# Patient Record
Sex: Female | Born: 2000 | Race: Black or African American | Hispanic: No | Marital: Single | Smoking: Current some day smoker
Health system: Southern US, Community
[De-identification: ages and names within clinical notes are randomized; demographics above are authoritative.]

## PROBLEM LIST (undated history)

## (undated) ENCOUNTER — Inpatient Hospital Stay (HOSPITAL_COMMUNITY): Payer: Self-pay

## (undated) DIAGNOSIS — R569 Unspecified convulsions: Secondary | ICD-10-CM

## (undated) DIAGNOSIS — F32A Depression, unspecified: Secondary | ICD-10-CM

## (undated) DIAGNOSIS — D649 Anemia, unspecified: Secondary | ICD-10-CM

## (undated) HISTORY — DX: Unspecified convulsions: R56.9

## (undated) HISTORY — DX: Depression, unspecified: F32.A

## (undated) HISTORY — DX: Anemia, unspecified: D64.9

---
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
## 2019-01-13 ENCOUNTER — Other Ambulatory Visit: Payer: Self-pay

## 2019-01-13 ENCOUNTER — Emergency Department (HOSPITAL_COMMUNITY)
Admission: EM | Admit: 2019-01-13 | Discharge: 2019-01-13 | Disposition: A | Discharge status: Home / Self Care | Payer: Medicaid Other | Attending: Emergency Medicine | Admitting: Emergency Medicine

## 2019-01-13 ENCOUNTER — Encounter (HOSPITAL_COMMUNITY): Payer: Self-pay | Admitting: Emergency Medicine

## 2019-01-13 ENCOUNTER — Emergency Department (HOSPITAL_COMMUNITY): Payer: Medicaid Other

## 2019-01-13 DIAGNOSIS — M79671 Pain in right foot: Secondary | ICD-10-CM | POA: Diagnosis not present

## 2019-01-13 DIAGNOSIS — Z79899 Other long term (current) drug therapy: Secondary | ICD-10-CM | POA: Diagnosis not present

## 2019-01-13 MED ORDER — IBUPROFEN 400 MG PO TABS
800.0000 mg | ORAL_TABLET | Freq: Once | ORAL | Status: AC
  Administered 2019-01-13: 800 mg via ORAL
  Filled 2019-01-13: qty 2

## 2019-01-13 NOTE — ED Notes (Signed)
Patient transported to X-ray 

## 2019-01-13 NOTE — ED Triage Notes (Signed)
Pt reports burning pain and a "knot" to the side of her foot. Denies any rashes or redness, no trauma or known injury.

## 2019-01-13 NOTE — ED Provider Notes (Addendum)
[facility] EMERGENCY DEPARTMENT Provider Note   CSN: [id number] Arrival date & time: 01/13/19  1449     History Chief Complaint  Patient presents with  . Foot Pain    Amy Elliott is a 19 y.o. female.  Patient arrives to ED via POV with complaints of right foot pain. She noticed the pain starting yesterday while she was at work. She states that she stands constantly at work and felt a burning sensation and felt a knot in her foot. She treated at home with aspirin and tylenol with no relief of symptoms. She has elevated her foot with little relief in symptoms, pain worse with standing and ambulation. No fevers. She has full sensation to right foot. Foot is warm to touch with 2+ pedal pulses. She has no pertinent past medical history, no recent illness or sick contacts, vaccines UTD.        History reviewed. No pertinent past medical history.  Patient Active Problem List   Diagnosis Date Noted  . Prediabetes 03/17/2018  . Moderate episode of recurrent major depressive disorder ([place]) 04/07/2017  . GAD (generalized anxiety disorder) 04/07/2017  . Sleep difficulties 04/07/2017  . Morbid obesity ([place]) 08/09/2016    History reviewed. No pertinent surgical history.   OB History   No obstetric history on file.     Family History  Problem Relation Age of Onset  . Hypertension Mother   . Diabetes Maternal Grandfather     Social History   Tobacco Use  . Smoking status: Never Smoker  . Smokeless tobacco: Never Used  Substance Use Topics  . Alcohol use: No  . Drug use: No    Home Medications Prior to Admission medications   Medication Sig Start Date End Date Taking? Authorizing Provider  acetaminophen (TYLENOL) 500 MG tablet Take 1,000 mg by mouth every 6 (six) hours as needed for headache.    [provider]  norethindrone-ethinyl estradiol-iron (JUNEL FE 1.5/30) 1.5-30 MG-MCG tablet Take 1 tablet by mouth daily. Patient not taking: Reported  on 03/29/2018 04/07/17   Jonathon Resides T, FNP  phentermine 30 MG capsule Take 1 capsule (30 mg total) by mouth every morning. 03/29/18   Dorna Leitz, MD    Allergies    Banana  Review of Systems   Review of Systems  Constitutional: Negative for chills and fever.  HENT: Negative for ear pain and sore throat.   Eyes: Negative for pain and visual disturbance.  Respiratory: Negative for cough and shortness of breath.   Cardiovascular: Negative for chest pain and palpitations.  Gastrointestinal: Negative for abdominal pain and vomiting.  Genitourinary: Negative for dysuria and hematuria.  Musculoskeletal: Negative for arthralgias and back pain.  Skin: Negative for color change and rash.  Neurological: Negative for seizures and syncope.  All other systems reviewed and are negative.   Physical Exam Updated Vital Signs BP 128/70   Pulse 81   Temp 98.2 F (36.8 C) (Oral)   Resp 12   SpO2 100%   Physical Exam Vitals and nursing note reviewed.  Constitutional:      General: She is not in acute distress.    Appearance: Normal appearance. She is obese. She is not ill-appearing.  HENT:     Head: Normocephalic.     Right Ear: Tympanic membrane, ear canal and external ear normal.     Left Ear: Tympanic membrane, ear canal and external ear normal.     Nose: Nose normal.     Mouth/Throat:  Mouth: Mucous membranes are moist.     Pharynx: Oropharynx is clear.  Eyes:     Extraocular Movements: Extraocular movements intact.     Pupils: Pupils are equal, round, and reactive to light.  Cardiovascular:     Rate and Rhythm: Normal rate and regular rhythm.     Pulses: Normal pulses.     Heart sounds: Normal heart sounds.  Pulmonary:     Effort: Pulmonary effort is normal.     Breath sounds: Normal breath sounds.  Abdominal:     General: Bowel sounds are normal.     Palpations: Abdomen is soft.  Musculoskeletal:     Cervical back: Normal range of motion and neck supple.   Skin:    General: Skin is warm and dry.     Capillary Refill: Capillary refill takes less than 2 seconds.  Neurological:     General: No focal deficit present.     Mental Status: She is alert.     ED Results / Procedures / Treatments   Labs (all labs ordered are listed, but only abnormal results are displayed) Labs Reviewed - No data to display  EKG None  Radiology DG Foot Complete Right  Result Date: 01/13/2019 CLINICAL DATA:  Pt reports burning pain and a "knot" to the side of her foot. Denies any rashes or redness, no trauma or known injury. EXAM: RIGHT FOOT COMPLETE - 3+ VIEW COMPARISON:  None. FINDINGS: There is no evidence of fracture or dislocation. There is no evidence of arthropathy or other focal bone abnormality. Soft tissues are unremarkable. IMPRESSION: Negative right foot radiographs. Electronically Signed   By: Emmaline Kluver M.D.   On: 01/13/2019 15:46    Procedures Procedures (including critical care time)  Medications Ordered in ED Medications  ibuprofen (ADVIL) tablet 800 mg (800 mg Oral Given 01/13/19 1542)    ED Course  I have reviewed the triage vital signs and the nursing notes.  Pertinent labs & imaging results that were available during my care of the patient were reviewed by me and considered in my medical decision making (see chart for details).    MDM Rules/Calculators/A&P                      Pain to tarsometatarsal joint head at 5th metatarsal on right foot with associated lateral right foot pain on palpation. No swelling or redness to foot noticed. Pain with standing and ambulation, states that pain is "burning" sensation. Works standing up all day and states pain increased with prolonged standing and ambulation. Good sensation to all toes and able to move all toes. DP pulses 2+. Will obtain XR of right foot and treat pain with ibuprofen. Will reassess s/p XR.   1550: XR reviewed by myself, no fracture identified. There is no avulsion  fracture or diastasis. Unlikely gout given patients age and location of pain. XR shows no arthropathy, so unlikely RA. This is likely a stress-induced injury given patients weight and job requirements. Will recommend RICE therapy and apply ace wrap. Can treat with ibuprofen for pain, follow up with PCP if no improvement in pain within the next week. Patient in agreement with this plan. Will send home with work note for Monday, she will use these therapies over the weekend and if she feels better she will go to work on Monday.   Final Clinical Impression(s) / ED Diagnoses Final diagnoses:  Right foot pain    Rx / DC Orders ED Discharge Orders  None       Orma Flaming, NP 01/13/19 1559    Orma Flaming, NP 01/13/19 1612    Vicki Mallet, MD 01/13/19 [phone]

## 2019-11-08 DIAGNOSIS — Z20822 Contact with and (suspected) exposure to covid-19: Secondary | ICD-10-CM | POA: Diagnosis not present

## 2020-02-13 ENCOUNTER — Encounter: Payer: Self-pay | Admitting: Pediatrics

## 2020-02-13 ENCOUNTER — Other Ambulatory Visit (HOSPITAL_COMMUNITY)
Admission: RE | Admit: 2020-02-13 | Discharge: 2020-02-13 | Disposition: A | Discharge status: Home / Self Care | Payer: Medicaid Other | Source: Ambulatory Visit | Attending: Pediatrics | Admitting: Pediatrics

## 2020-02-13 ENCOUNTER — Other Ambulatory Visit: Payer: Self-pay

## 2020-02-13 ENCOUNTER — Ambulatory Visit (INDEPENDENT_AMBULATORY_CARE_PROVIDER_SITE_OTHER): Payer: Medicaid Other | Admitting: Pediatrics

## 2020-02-13 VITALS — BP 126/74 | Ht 66.4 in | Wt 219.0 lb

## 2020-02-13 DIAGNOSIS — A530 Latent syphilis, unspecified as early or late: Secondary | ICD-10-CM

## 2020-02-13 DIAGNOSIS — Z0001 Encounter for general adult medical examination with abnormal findings: Secondary | ICD-10-CM | POA: Diagnosis not present

## 2020-02-13 DIAGNOSIS — R634 Abnormal weight loss: Secondary | ICD-10-CM | POA: Diagnosis not present

## 2020-02-13 DIAGNOSIS — Z113 Encounter for screening for infections with a predominantly sexual mode of transmission: Secondary | ICD-10-CM | POA: Insufficient documentation

## 2020-02-13 DIAGNOSIS — N644 Mastodynia: Secondary | ICD-10-CM | POA: Diagnosis not present

## 2020-02-13 DIAGNOSIS — Z114 Encounter for screening for human immunodeficiency virus [HIV]: Secondary | ICD-10-CM | POA: Diagnosis not present

## 2020-02-13 DIAGNOSIS — Z3201 Encounter for pregnancy test, result positive: Secondary | ICD-10-CM | POA: Diagnosis not present

## 2020-02-13 DIAGNOSIS — Z0000 Encounter for general adult medical examination without abnormal findings: Secondary | ICD-10-CM

## 2020-02-13 DIAGNOSIS — Z23 Encounter for immunization: Secondary | ICD-10-CM | POA: Diagnosis not present

## 2020-02-13 LAB — POCT RAPID HIV: Rapid HIV, POC: NEGATIVE

## 2020-02-13 LAB — POCT URINE PREGNANCY: Preg Test, Ur: POSITIVE — AB

## 2020-02-13 NOTE — Progress Notes (Signed)
Adolescent Well Care Visit Amy Elliott is a 20 y.o. female who is here for well care.    PCP:  Ancil LinseyGrant, Breelle Hollywood L, MD   History was provided by the patient and mother.   Current Issues: Current concerns include concerns for STI and pregnancy testing  Heart condition referral due to mom having surgery recently .   Breast pain- having strong pain that feels like nerves. Does not last for a long time. Rubs it to make it better. Does do regular breast exams.   Nutrition: Nutrition/Eating Behaviors: has been limiting portions but unsure how she lost as much weight  Adequate calcium in diet?: yes  Supplements/ Vitamins: none   Social Screening: Living with boyfriend and working and going to school.    Menstruation:   No LMP recorded. 01/25/2020 Menstrual History: stopped depo and took OCPs but stopped because she did not like how it made her feel.    Confidential Social History: Smokes marijuana occassionally and drinks alcohol.   Sexually Active?  yes  ; unprotected on birthday Pregnancy Prevention: none   Safe at home, in school & in relationships?  Yes Safe to self?  Yes   Screenings: Patient has a dental home: yes   Physical Exam:  Vitals:   02/13/20 1343  BP: 126/74  Weight: 219 lb (99.3 kg)  Height: 5' 6.4" (1.687 m)   Wt Readings from Last 3 Encounters:  02/13/20 219 lb (99.3 kg)  03/29/18 265 lb (120.2 kg) (>99 %, Z= 2.59)*  03/01/18 262 lb 12.8 oz (119.2 kg) (>99 %, Z= 2.57)*   * Growth percentiles are based on CDC (Girls, 2-20 Years) data.    BP 126/74 (BP Location: Right Arm, Patient Position: Sitting)   Ht 5' 6.4" (1.687 m)   Wt 219 lb (99.3 kg)   BMI 34.92 kg/m  Body mass index: body mass index is 34.92 kg/m. Growth percentile SmartLinks can only be used for patients less than 21 years old.   Hearing Screening   125Hz  250Hz  500Hz  1000Hz  2000Hz  3000Hz  4000Hz  6000Hz  8000Hz   Right ear:   20 20 20  20     Left ear:   20 20 20  20       Visual Acuity  Screening   Right eye Left eye Both eyes  Without correction:     With correction: 20/20 20/30 20/20   Comments: With contact on   General Appearance:   alert, oriented, no acute distress  HENT: Normocephalic, no obvious abnormality, conjunctiva clear  Mouth:   Normal appearing teeth, no obvious discoloration, dental caries, or dental caps  Neck:   Supple; thyroid: no enlargement, symmetric, no tenderness/mass/nodules  Chest No breast abnormalities   Lungs:   Clear to auscultation bilaterally, normal work of breathing  Heart:   Regular rate and rhythm, S1 and S2 normal, no murmurs;   Abdomen:   Soft, non-tender, no mass, or organomegaly  GU genitalia not examined  Musculoskeletal:   Tone and strength strong and symmetrical, all extremities               Lymphatic:   No cervical adenopathy  Skin/Hair/Nails:   Skin warm, dry and intact, no rashes, no bruises or petechiae  Neurologic:   Strength, gait, and coordination normal and age-appropriate   Recent Results (from the past 2160 hour(s))  Urine cytology ancillary only     Status: None   Collection Time: 02/13/20  1:41 PM  Result Value Ref Range   Chlamydia Negative  Neisseria Gonorrhea Negative    Comment Normal Reference Ranger Chlamydia - Negative    Comment      Normal Reference Range Neisseria Gonorrhea - Negative  POCT Rapid HIV     Status: Normal   Collection Time: 02/13/20  2:13 PM  Result Value Ref Range   Rapid HIV, POC Negative   TSH + free T4     Status: None   Collection Time: 02/13/20  2:36 PM  Result Value Ref Range   TSH W/REFLEX TO FT4 0.67 mIU/L    Comment:           Reference Range .           > or = 20 Years  0.40-4.50 .                Pregnancy Ranges           First trimester    0.26-2.66           Second trimester   0.55-2.73           Third trimester    0.43-2.91   Hemoglobin A1c     Status: None   Collection Time: 02/13/20  2:36 PM  Result Value Ref Range   Hgb A1c MFr Bld 5.3 <5.7 % of  total Hgb    Comment: For the purpose of screening for the presence of diabetes: . <5.7%       Consistent with the absence of diabetes 5.7-6.4%    Consistent with increased risk for diabetes             (prediabetes) > or =6.5%  Consistent with diabetes . This assay result is consistent with a decreased risk of diabetes. . Currently, no consensus exists regarding use of hemoglobin A1c for diagnosis of diabetes in children. . According to American Diabetes Association (ADA) guidelines, hemoglobin A1c <7.0% represents optimal control in non-pregnant diabetic patients. Different metrics may apply to specific patient populations.  Standards of Medical Care in Diabetes(ADA). .    Mean Plasma Glucose 105 mg/dL   eAG (mmol/L) 5.8 mmol/L  CBC with Differential/Platelet     Status: Abnormal   Collection Time: 02/13/20  2:36 PM  Result Value Ref Range   WBC 5.9 3.8 - 10.8 Thousand/uL   RBC 3.98 3.80 - 5.10 Million/uL   Hemoglobin 11.4 (L) 11.7 - 15.5 g/dL   HCT 75.1 (L) 02.5 - 85.2 %   MCV 85.7 80.0 - 100.0 fL   MCH 28.6 27.0 - 33.0 pg   MCHC 33.4 32.0 - 36.0 g/dL   RDW 77.8 24.2 - 35.3 %   Platelets 308 140 - 400 Thousand/uL   MPV 10.7 7.5 - 12.5 fL   Neutro Abs 2,974 1,500 - 7,800 cells/uL   Lymphs Abs 2,336 850 - 3,900 cells/uL   Absolute Monocytes 419 200 - 950 cells/uL   Eosinophils Absolute 142 15 - 500 cells/uL   Basophils Absolute 30 0 - 200 cells/uL   Neutrophils Relative % 50.4 %   Total Lymphocyte 39.6 %   Monocytes Relative 7.1 %   Eosinophils Relative 2.4 %   Basophils Relative 0.5 %  Lipid panel     Status: None   Collection Time: 02/13/20  2:36 PM  Result Value Ref Range   Cholesterol 151 <200 mg/dL   HDL 71 > OR = 50 mg/dL   Triglycerides 40 <614 mg/dL   LDL Cholesterol (Calc) 69 mg/dL (calc)    Comment: Reference range: <100 .  Desirable range <100 mg/dL for primary prevention;   <70 mg/dL for patients with CHD or diabetic patients  with > or = 2 CHD  risk factors. Marland Kitchen LDL-C is now calculated using the Martin-Hopkins  calculation, which is a validated novel method providing  better accuracy than the Friedewald equation in the  estimation of LDL-C.  Horald Pollen et al. Lenox Ahr. 5277;824(23): 2061-2068  (http://education.[facility].com/faq/FAQ164)    Total CHOL/HDL Ratio 2.1 <5.0 (calc)   Non-HDL Cholesterol (Calc) 80 <536 mg/dL (calc)    Comment: For patients with diabetes plus 1 major ASCVD risk  factor, treating to a non-HDL-C goal of <100 mg/dL  (LDL-C of <14 mg/dL) is considered a therapeutic  option.   VITAMIN D 25 Hydroxy (Vit-D Deficiency, Fractures)     Status: Abnormal   Collection Time: 02/13/20  2:36 PM  Result Value Ref Range   Vit D, 25-Hydroxy 11 (L) 30 - 100 ng/mL    Comment: Vitamin D Status         25-OH Vitamin D: . Deficiency:                    <20 ng/mL Insufficiency:             20 - 29 ng/mL Optimal:                 > or = 30 ng/mL . For 25-OH Vitamin D testing on patients on  D2-supplementation and patients for whom quantitation  of D2 and D3 fractions is required, the QuestAssureD(TM) 25-OH VIT D, (D2,D3), LC/MS/MS is recommended: order  code 43154 (patients >66yrs). See Note 1 . Note 1 . For additional information, please refer to  http://education.[facility].com/faq/FAQ199  (This link is being provided for informational/ educational purposes only.)   Comprehensive metabolic panel     Status: None   Collection Time: 02/13/20  2:36 PM  Result Value Ref Range   Glucose, Bld 74 65 - 99 mg/dL    Comment: .            Fasting reference interval .    BUN 11 7 - 25 mg/dL   Creat 0.08 6.76 - 1.95 mg/dL   BUN/Creatinine Ratio NOT APPLICABLE 6 - 22 (calc)   Sodium 138 135 - 146 mmol/L   Potassium 4.1 3.5 - 5.3 mmol/L   Chloride 105 98 - 110 mmol/L   CO2 23 20 - 32 mmol/L   Calcium 9.4 8.6 - 10.2 mg/dL   Total Protein 6.8 6.1 - 8.1 g/dL   Albumin 4.0 3.6 - 5.1 g/dL   Globulin 2.8 1.9 - 3.7  g/dL (calc)   AG Ratio 1.4 1.0 - 2.5 (calc)   Total Bilirubin 0.5 0.2 - 1.2 mg/dL   Alkaline phosphatase (APISO) 60 31 - 125 U/L   AST 16 10 - 30 U/L   ALT 7 6 - 29 U/L  RPR     Status: Abnormal   Collection Time: 02/13/20  2:36 PM  Result Value Ref Range   RPR Ser Ql REACTIVE (A) NON-REACTI  Rpr titer     Status: Abnormal   Collection Time: 02/13/20  2:36 PM  Result Value Ref Range   RPR Titer 1:1 (H)   Fluorescent treponemal ab(fta)-IgG-bld     Status: None   Collection Time: 02/13/20  2:36 PM  Result Value Ref Range   Fluorescent Treponemal ABS NON-REACTIVE NON-REACTI    Comment: Verified by repeat analysis. . The FTA-ABS is a treponemal assay that is  intended to  be used with other tests (e.g. RPR) as part of a  diagnostic algorithm in the diagnosis of syphilis. .   Urine cytology ancillary only     Status: Abnormal   Collection Time: 02/13/20  3:51 PM  Result Value Ref Range   Trichomonas Negative    Chlamydia Negative    Neisseria Gonorrhea Negative    Candida Urine Negative    Bacterial Vaginitis-Urine Positive (A)    Molecular Comment      This specimen does not meet the strict criteria set by the FDA. The   Molecular Comment      result interpretation should be considered in conjunction with the   Molecular Comment patient's clinical history.    Comment Normal Reference Range Trichomonas - Negative    Comment Normal Reference Ranger Chlamydia - Negative    Comment      Normal Reference Range Neisseria Gonorrhea - Negative  POCT urine pregnancy     Status: Abnormal   Collection Time: 02/13/20  5:52 PM  Result Value Ref Range   Preg Test, Ur Positive (A) Negative    Comment: VERY FAINT  Pregnancy, urine     Status: None   Collection Time: 02/13/20  5:55 PM  Result Value Ref Range   Preg Test, Ur NEGATIVE NEGATIVE  B-HCG Quant     Status: None   Collection Time: 02/13/20  5:55 PM  Result Value Ref Range   HCG, Total, QN <3 mIU/mL    Comment: Gestational  Age   Expected hCG values (mIU/mL) <1 Week:                 5-50 1-2 Weeks:               50-500 2-3 Weeks:               [phone] 3-4 Weeks:               500-10000 4-5 Weeks:               1000-50000 5-6 Weeks:               10000-100000 6-8 Weeks:               15000-200000 2-3 Months:              10000-100000 The table above provides only a very rough estimate of gestational age and should be used only in conjunction with other methods for establishing gestational age. Much more reliable and accurate estimations of gestational age may be obtained by using LMP or ultrasound. . . Values from different assay methods may vary. The use of this assay to monitor or to diagnose  patients with cancer or any condition unrelated to pregnancy has not been cleared or approved by the FDA or the manufacturer of the assay.   I-Stat beta hCG blood, ED     Status: None   Collection Time: 02/25/20  9:29 AM  Result Value Ref Range   I-stat hCG, quantitative <5.0 <5 mIU/mL   Comment 3            Comment:   GEST. AGE      CONC.  (mIU/mL)   <=1 WEEK        5 - 50     2 WEEKS       50 - 500     3 WEEKS       100 - 10,000  4 WEEKS     1,000 - 30,000        FEMALE AND NON-PREGNANT FEMALE:     LESS THAN 5 mIU/mL   Urinalysis, Routine w reflex microscopic Urine, Clean Catch     Status: Abnormal   Collection Time: 02/25/20  9:53 AM  Result Value Ref Range   Color, Urine YELLOW YELLOW   APPearance HAZY (A) CLEAR   Specific Gravity, Urine 1.032 (H) 1.005 - 1.030   pH 5.0 5.0 - 8.0   Glucose, UA NEGATIVE NEGATIVE mg/dL   Hgb urine dipstick LARGE (A) NEGATIVE   Bilirubin Urine NEGATIVE NEGATIVE   Ketones, ur NEGATIVE NEGATIVE mg/dL   Protein, ur 30 (A) NEGATIVE mg/dL   Nitrite NEGATIVE NEGATIVE   Leukocytes,Ua NEGATIVE NEGATIVE   RBC / HPF >50 (H) 0 - 5 RBC/hpf   WBC, UA 0-5 0 - 5 WBC/hpf   Bacteria, UA NONE SEEN NONE SEEN   Squamous Epithelial / LPF 6-10 0 - 5   Mucus PRESENT      Comment: Performed at [facility], [address], [place], [place] [address]  Basic metabolic panel     Status: None   Collection Time: 02/25/20  9:58 AM  Result Value Ref Range   Sodium 140 135 - 145 mmol/L   Potassium 3.9 3.5 - 5.1 mmol/L   Chloride 106 98 - 111 mmol/L   CO2 25 22 - 32 mmol/L   Glucose, Bld 97 70 - 99 mg/dL    Comment: Glucose reference range applies only to samples taken after fasting for at least 8 hours.   BUN 9 6 - 20 mg/dL   Creatinine, Ser 6.57 0.44 - 1.00 mg/dL   Calcium 9.2 8.9 - 84.6 mg/dL   GFR, Estimated >96 >29 mL/min    Comment: (NOTE) Calculated using the CKD-EPI Creatinine Equation (2021)    Anion gap 9 5 - 15    Comment: Performed at [facility], [address] [place], [place] [address]  CBC with Differential     Status: None   Collection Time: 02/25/20  9:58 AM  Result Value Ref Range   WBC 6.0 4.0 - 10.5 K/uL   RBC 4.44 3.87 - 5.11 MIL/uL   Hemoglobin 12.7 12.0 - 15.0 g/dL   HCT 32.4 40.1 - 02.7 %   MCV 88.5 80.0 - 100.0 fL   MCH 28.6 26.0 - 34.0 pg   MCHC 32.3 30.0 - 36.0 g/dL   RDW 25.3 66.4 - 40.3 %   Platelets 308 150 - 400 K/uL   nRBC 0.0 0.0 - 0.2 %   Neutrophils Relative % 46 %   Neutro Abs 2.8 1.7 - 7.7 K/uL   Lymphocytes Relative 42 %   Lymphs Abs 2.5 0.7 - 4.0 K/uL   Monocytes Relative 8 %   Monocytes Absolute 0.5 0.1 - 1.0 K/uL   Eosinophils Relative 3 %   Eosinophils Absolute 0.2 0.0 - 0.5 K/uL   Basophils Relative 1 %   Basophils Absolute 0.0 0.0 - 0.1 K/uL   Immature Granulocytes 0 %   Abs Immature Granulocytes 0.01 0.00 - 0.07 K/uL    Comment: Performed at [facility], [address], [place], [place] [address]  RPR     Status: Abnormal   Collection Time: 02/25/20 10:09 AM  Result Value Ref Range   RPR Ser Ql Reactive (A) NON REACTIVE    Comment: SENT FOR CONFIRMATION   RPR Titer 1:1  Comment: Performed at [facility], [address] [place], [place] [address]  GC/Chlamydia probe  amp ()not at [facility]     Status: None   Collection Time: 02/25/20 10:09 AM  Result Value Ref Range   Chlamydia Negative    Neisseria Gonorrhea Negative    Comment Normal Reference Ranger Chlamydia - Negative    Comment      Normal Reference Range Neisseria Gonorrhea - Negative  HIV Antibody (routine testing w rflx)     Status: None   Collection Time: 02/25/20 10:09 AM  Result Value Ref Range   HIV Screen 4th Generation wRfx Non Reactive Non Reactive    Comment: Performed at [facility], [address], [place], [place] [address]  T.pallidum Ab, Total     Status: None   Collection Time: 02/25/20 10:09 AM  Result Value Ref Range   T Pallidum Abs Non Reactive Non Reactive    Comment: (NOTE) Performed At: [facility] [address] [place], [place] [id number] Jolene Schimke MD KG:[id number]   Wet prep, genital     Status: None   Collection Time: 02/25/20 10:10 AM   Specimen: Genital  Result Value Ref Range   Yeast Wet Prep HPF POC NONE SEEN NONE SEEN   Trich, Wet Prep NONE SEEN NONE SEEN   Clue Cells Wet Prep HPF POC NONE SEEN NONE SEEN   WBC, Wet Prep HPF POC NONE SEEN NONE SEEN   Sperm NONE SEEN     Comment: Performed at [facility], [address], [place], [place] [address]     Assessment and Plan:   Amy Elliott is a 20 yo F here for well check.   1. Routine screening for STI (sexually transmitted infection)  - Urine cytology ancillary only - POCT Rapid HIV - POCT urine pregnancy - RPR - Ambulatory referral to Gynecology - Urine cytology ancillary only  2. Encounter for general adult medical examination without abnormal findings Hgb A1 c trending down with weight loss and improved BMI.  Noted to have Vitamin D deficiency and should begin daily 2000 Units until seen in adult clinic with discussion for 50k U  - Hemoglobin A1c - CBC with Differential/Platelet - Lipid panel - VITAMIN D 25 Hydroxy (Vit-D Deficiency, Fractures) -  Comprehensive metabolic panel - Ambulatory referral to [facility]  4. Weight loss  - TSH + free T4  5. Breast pain  - Ambulatory referral to Gynecology  6. Positive urine pregnancy test Urine test positive but beta negative  - Pregnancy, urine - B-HCG Quant  7. Positive RPR test RPR reactive 1:1 with negative treponemal antibodies False positive No treatment    Return in 1 year (on 02/12/2021).Marland Kitchen  Ancil Linsey, MD

## 2020-02-14 LAB — URINE CYTOLOGY ANCILLARY ONLY
Bacterial Vaginitis-Urine: POSITIVE — AB
Candida Urine: NEGATIVE
Chlamydia: NEGATIVE
Chlamydia: NEGATIVE
Comment: NEGATIVE
Comment: NORMAL
Comment: NEGATIVE
Comment: NEGATIVE
Comment: NORMAL
Neisseria Gonorrhea: NEGATIVE
Neisseria Gonorrhea: NEGATIVE
Trichomonas: NEGATIVE

## 2020-02-14 LAB — PREGNANCY, URINE: Preg Test, Ur: NEGATIVE

## 2020-02-14 LAB — HCG, QUANTITATIVE, PREGNANCY: HCG, Total, QN: <3 m[IU]/mL

## 2020-02-15 LAB — COMPREHENSIVE METABOLIC PANEL
AG Ratio: 1.4 (calc) (ref 1.0–2.5)
ALT: 7 U/L (ref 6–29)
AST: 16 U/L (ref 10–30)
Albumin: 4 g/dL (ref 3.6–5.1)
Alkaline phosphatase (APISO): 60 U/L (ref 31–125)
BUN: 11 mg/dL (ref 7–25)
CO2: 23 mmol/L (ref 20–32)
Calcium: 9.4 mg/dL (ref 8.6–10.2)
Chloride: 105 mmol/L (ref 98–110)
Creat: 0.83 mg/dL (ref 0.50–1.10)
Globulin: 2.8 g/dL (calc) (ref 1.9–3.7)
Glucose, Bld: 74 mg/dL (ref 65–99)
Potassium: 4.1 mmol/L (ref 3.5–5.3)
Sodium: 138 mmol/L (ref 135–146)
Total Bilirubin: 0.5 mg/dL (ref 0.2–1.2)
Total Protein: 6.8 g/dL (ref 6.1–8.1)

## 2020-02-15 LAB — CBC WITH DIFFERENTIAL/PLATELET
Absolute Monocytes: 419 cells/uL (ref 200–950)
Basophils Absolute: 30 cells/uL (ref 0–200)
Basophils Relative: 0.5 %
Eosinophils Absolute: 142 cells/uL (ref 15–500)
Eosinophils Relative: 2.4 %
HCT: 34.1 % — ABNORMAL LOW (ref 35.0–45.0)
Hemoglobin: 11.4 g/dL — ABNORMAL LOW (ref 11.7–15.5)
Lymphs Abs: 2336 cells/uL (ref 850–3900)
MCH: 28.6 pg (ref 27.0–33.0)
MCHC: 33.4 g/dL (ref 32.0–36.0)
MCV: 85.7 fL (ref 80.0–100.0)
MPV: 10.7 fL (ref 7.5–12.5)
Monocytes Relative: 7.1 %
Neutro Abs: 2974 cells/uL (ref 1500–7800)
Neutrophils Relative %: 50.4 %
Platelets: 308 10*3/uL (ref 140–400)
RBC: 3.98 10*6/uL (ref 3.80–5.10)
RDW: 12.5 % (ref 11.0–15.0)
Total Lymphocyte: 39.6 %
WBC: 5.9 10*3/uL (ref 3.8–10.8)

## 2020-02-15 LAB — FLUORESCENT TREPONEMAL AB(FTA)-IGG-BLD: Fluorescent Treponemal ABS: NONREACTIVE

## 2020-02-15 LAB — LIPID PANEL
Cholesterol: 151 mg/dL (ref <200)
HDL: 71 mg/dL (ref >=50)
LDL Cholesterol (Calc): 69 mg/dL (calc)
Non-HDL Cholesterol (Calc): 80 mg/dL (calc) (ref <130)
Total CHOL/HDL Ratio: 2.1 (calc) (ref <5.0)
Triglycerides: 40 mg/dL (ref <150)

## 2020-02-15 LAB — HEMOGLOBIN A1C
Hgb A1c MFr Bld: 5.3 % of total Hgb (ref <5.7)
Mean Plasma Glucose: 105 mg/dL
eAG (mmol/L): 5.8 mmol/L

## 2020-02-15 LAB — TSH+FREE T4: TSH W/REFLEX TO FT4: 0.67 mIU/L

## 2020-02-15 LAB — VITAMIN D 25 HYDROXY (VIT D DEFICIENCY, FRACTURES): Vit D, 25-Hydroxy: 11 ng/mL — ABNORMAL LOW (ref 30–100)

## 2020-02-15 LAB — RPR TITER: RPR Titer: 1:1 {titer} — ABNORMAL HIGH

## 2020-02-15 LAB — RPR: RPR Ser Ql: REACTIVE — AB

## 2020-02-25 ENCOUNTER — Emergency Department (HOSPITAL_COMMUNITY)
Admission: EM | Admit: 2020-02-25 | Discharge: 2020-02-25 | Disposition: A | Discharge status: Home / Self Care | Payer: Medicaid Other | Attending: Emergency Medicine | Admitting: Emergency Medicine

## 2020-02-25 ENCOUNTER — Other Ambulatory Visit: Payer: Self-pay

## 2020-02-25 ENCOUNTER — Encounter (HOSPITAL_COMMUNITY): Payer: Self-pay | Admitting: *Deleted

## 2020-02-25 DIAGNOSIS — I10 Essential (primary) hypertension: Secondary | ICD-10-CM | POA: Insufficient documentation

## 2020-02-25 DIAGNOSIS — N939 Abnormal uterine and vaginal bleeding, unspecified: Secondary | ICD-10-CM | POA: Diagnosis not present

## 2020-02-25 LAB — CBC WITH DIFFERENTIAL/PLATELET
Abs Immature Granulocytes: 0.01 10*3/uL (ref 0.00–0.07)
Basophils Absolute: 0 10*3/uL (ref 0.0–0.1)
Basophils Relative: 1 %
Eosinophils Absolute: 0.2 10*3/uL (ref 0.0–0.5)
Eosinophils Relative: 3 %
HCT: 39.3 % (ref 36.0–46.0)
Hemoglobin: 12.7 g/dL (ref 12.0–15.0)
Immature Granulocytes: 0 %
Lymphocytes Relative: 42 %
Lymphs Abs: 2.5 10*3/uL (ref 0.7–4.0)
MCH: 28.6 pg (ref 26.0–34.0)
MCHC: 32.3 g/dL (ref 30.0–36.0)
MCV: 88.5 fL (ref 80.0–100.0)
Monocytes Absolute: 0.5 10*3/uL (ref 0.1–1.0)
Monocytes Relative: 8 %
Neutro Abs: 2.8 10*3/uL (ref 1.7–7.7)
Neutrophils Relative %: 46 %
Platelets: 308 10*3/uL (ref 150–400)
RBC: 4.44 MIL/uL (ref 3.87–5.11)
RDW: 13.1 % (ref 11.5–15.5)
WBC: 6 10*3/uL (ref 4.0–10.5)
nRBC: 0 % (ref 0.0–0.2)

## 2020-02-25 LAB — WET PREP, GENITAL
Clue Cells Wet Prep HPF POC: NONE SEEN
Sperm: NONE SEEN
Trich, Wet Prep: NONE SEEN
WBC, Wet Prep HPF POC: NONE SEEN
Yeast Wet Prep HPF POC: NONE SEEN

## 2020-02-25 LAB — BASIC METABOLIC PANEL
Anion gap: 9 (ref 5–15)
BUN: 9 mg/dL (ref 6–20)
CO2: 25 mmol/L (ref 22–32)
Calcium: 9.2 mg/dL (ref 8.9–10.3)
Chloride: 106 mmol/L (ref 98–111)
Creatinine, Ser: 0.86 mg/dL (ref 0.44–1.00)
GFR, Estimated: >60 mL/min (ref >60)
Glucose, Bld: 97 mg/dL (ref 70–99)
Potassium: 3.9 mmol/L (ref 3.5–5.1)
Sodium: 140 mmol/L (ref 135–145)

## 2020-02-25 LAB — URINALYSIS, ROUTINE W REFLEX MICROSCOPIC
Bacteria, UA: NONE SEEN
Bilirubin Urine: NEGATIVE
Glucose, UA: NEGATIVE mg/dL
Ketones, ur: NEGATIVE mg/dL
Leukocytes,Ua: NEGATIVE
Nitrite: NEGATIVE
Protein, ur: 30 mg/dL — AB
RBC / HPF: >50 RBC/hpf — ABNORMAL HIGH (ref 0–5)
Specific Gravity, Urine: 1.032 — ABNORMAL HIGH (ref 1.005–1.030)
pH: 5 (ref 5.0–8.0)

## 2020-02-25 LAB — I-STAT BETA HCG BLOOD, ED (MC, WL, AP ONLY): I-stat hCG, quantitative: <5 m[IU]/mL (ref <5)

## 2020-02-25 LAB — HIV ANTIBODY (ROUTINE TESTING W REFLEX): HIV Screen 4th Generation wRfx: NONREACTIVE

## 2020-02-25 NOTE — ED Triage Notes (Signed)
Pt reports having + pregnancy test and estimates she is approx [redacted] weeks pregnant. Having mild vaginal bleeding x 2 days. No distress noted at triage.

## 2020-02-25 NOTE — ED Provider Notes (Signed)
[facility] EMERGENCY DEPARTMENT Provider Note   CSN: [id number] Arrival date & time: 02/25/20  7106     History Chief Complaint  Patient presents with  . Vaginal Bleeding    Amy Elliott is a 20 y.o. female.  HPI She presents for evaluation of vaginal bleeding for 2 days.  She had a positive pregnancy test, urine, on 02/13/2020.  That same day, a hCG was done and was negative.  She states her last menstrual cycle was about 1 month ago.  She states her current bleeding is like a menses.  She denies vaginal discharge prior to the bleeding.  She denies weakness, dizziness, fever, chills, nausea or vomiting.  There are no other known modifying factors.    History reviewed. No pertinent past medical history.  Patient Active Problem List   Diagnosis Date Noted  . Prediabetes 03/17/2018  . Moderate episode of recurrent major depressive disorder (HCC) 04/07/2017  . GAD (generalized anxiety disorder) 04/07/2017  . Sleep difficulties 04/07/2017  . Morbid obesity (HCC) 08/09/2016    History reviewed. No pertinent surgical history.   OB History    Gravida  1   Para      Term      Preterm      AB      Living        SAB      IAB      Ectopic      Multiple      Live Births              Family History  Problem Relation Age of Onset  . Hypertension Mother   . Diabetes Maternal Grandfather     Social History   Tobacco Use  . Smoking status: Never Smoker  . Smokeless tobacco: Never Used  Vaping Use  . Vaping Use: Never used  Substance Use Topics  . Alcohol use: No  . Drug use: No    Home Medications Prior to Admission medications   Medication Sig Start Date End Date Taking? Authorizing Provider  acetaminophen (TYLENOL) 500 MG tablet Take 1,000 mg by mouth every 6 (six) hours as needed for headache.    [provider]  norethindrone-ethinyl estradiol-iron (JUNEL FE 1.5/30) 1.5-30 MG-MCG tablet Take 1 tablet by mouth  daily. Patient not taking: Reported on 02/13/2020 04/07/17   Alfonso Ramus T, FNP  phentermine 30 MG capsule Take 1 capsule (30 mg total) by mouth every morning. Patient not taking: Reported on 02/13/2020 03/29/18   Alexander Mt, MD    Allergies    Banana and Other  Review of Systems   Review of Systems  All other systems reviewed and are negative.   Physical Exam Updated Vital Signs BP (!) 148/98 (BP Location: Right Arm)   Pulse (!) 58   Temp 98.4 F (36.9 C) (Oral)   Resp 20   LMP 01/26/2020   SpO2 100%   Physical Exam Vitals and nursing note reviewed.  Constitutional:      General: She is not in acute distress.    Appearance: She is well-developed and well-nourished. She is not ill-appearing, toxic-appearing or diaphoretic.  HENT:     Head: Normocephalic and atraumatic.     Right Ear: External ear normal.     Left Ear: External ear normal.  Eyes:     Extraocular Movements: EOM normal.     Conjunctiva/sclera: Conjunctivae normal.     Pupils: Pupils are equal, round, and reactive to light.  Neck:     Trachea: Phonation normal.  Cardiovascular:     Rate and Rhythm: Normal rate.  Pulmonary:     Effort: Pulmonary effort is normal.  Chest:     Chest wall: No bony tenderness.  Abdominal:     General: There is no distension.  Genitourinary:    Comments: Blood on perineum otherwise normal external female genitalia.  Blood in vagina, appears to be coming from the cervical os.  Cervical os and cervix appeared normal.  Samples taken for testing.  On bimanual examination there is no pelvic tenderness or palpable organomegaly. Musculoskeletal:        General: Normal range of motion.     Cervical back: Normal range of motion and neck supple.  Skin:    General: Skin is warm, dry and intact.  Neurological:     Mental Status: She is alert and oriented to person, place, and time.     Cranial Nerves: No cranial nerve deficit.     Sensory: No sensory deficit.     Motor:  No abnormal muscle tone.     Coordination: Coordination normal.  Psychiatric:        Mood and Affect: Mood and affect and mood normal.        Behavior: Behavior normal.        Thought Content: Thought content normal.        Judgment: Judgment normal.     ED Results / Procedures / Treatments   Labs (all labs ordered are listed, but only abnormal results are displayed) Labs Reviewed  URINALYSIS, ROUTINE W REFLEX MICROSCOPIC - Abnormal; Notable for the following components:      Result Value   APPearance HAZY (*)    Specific Gravity, Urine 1.032 (*)    Hgb urine dipstick LARGE (*)    Protein, ur 30 (*)    RBC / HPF >50 (*)    All other components within normal limits  WET PREP, GENITAL  BASIC METABOLIC PANEL  CBC WITH DIFFERENTIAL/PLATELET  RPR  HIV ANTIBODY (ROUTINE TESTING W REFLEX)  I-STAT BETA HCG BLOOD, ED ([facility], [facility], [facility] ONLY)  GC/CHLAMYDIA PROBE AMP ([place]) NOT AT [facility]    EKG None  Radiology No results found.  Procedures Procedures   Medications Ordered in ED Medications - No data to display  ED Course  I have reviewed the triage vital signs and the nursing notes.  Pertinent labs & imaging results that were available during my care of the patient were reviewed by me and considered in my medical decision making (see chart for details).    MDM Rules/Calculators/A&P                           Patient Vitals for the past 24 hrs:  BP Temp Temp src Pulse Resp SpO2  02/25/20 1008 (!) 148/98 -- -- (!) 58 20 100 %  02/25/20 0843 (!) 145/93 98.4 F (36.9 C) Oral (!) 50 18 100 %    12:17 PM Reevaluation with update and discussion. After initial assessment and treatment, an updated evaluation reveals she is comfortable and calm.  Findings discussed with the patient all questions were answered. Mancel Bale   Medical Decision Making:  This patient is presenting for evaluation of suspected pregnancy with vaginal bleeding, which does require a range of treatment  options, and is a complaint that involves a high risk of morbidity and mortality. The differential diagnoses include spontaneous abortion, bleeding  pregnancy, menses. I decided to review old records, and in summary and female presenting for evaluation of bleeding with recent positive and negative pregnancy testing.  I did not require additional historical information from anyone.  Clinical Laboratory Tests Ordered, included CBC, Metabolic panel, Pregnancy test and Wet prep, STD screening. Review indicates beta-hCG is negative.  Remainder of available testing is normal.  STD testing is pending   Critical Interventions-clinical evaluation, laboratory testing, observation reassessment  After These Interventions, the Patient was reevaluated and was found to not be pregnant.  She likely had a miscarriage versus erroneous testing, earlier this month.  She is essentially having a current menses, and is otherwise well.  Doubt STD, or occult pregnancy.  CRITICAL CARE-no Performed by: Mancel Bale  Nursing Notes Reviewed/ Care Coordinated Applicable Imaging Reviewed Interpretation of Laboratory Data incorporated into ED treatment  The patient appears reasonably screened and/or stabilized for discharge and I doubt any other medical condition or other [facility] requiring further screening, evaluation, or treatment in the ED at this time prior to discharge.  Plan: Home Medications-OTC of choice; Home Treatments-PCP to check blood pressure in 1 to 2-week; return here if the recommended treatment, does not improve the symptoms; Recommended follow up-PCP follow-up 1 to 2 weeks     Final Clinical Impression(s) / ED Diagnoses Final diagnoses:  Vaginal bleeding  Hypertension, unspecified type    Rx / DC Orders ED Discharge Orders    None       Mancel Bale, MD 02/25/20 1218

## 2020-02-25 NOTE — Discharge Instructions (Signed)
The testing today indicates that you are not pregnant.  The bleeding you are currently having is essentially a normal menstrual cycle.  You may have had a miscarriage, since you did have a positive urine pregnancy test earlier this month.  Follow-up with your doctor for general care and a blood pressure check in a week or 2.

## 2020-02-26 ENCOUNTER — Telehealth: Payer: Self-pay

## 2020-02-26 LAB — RPR
RPR Ser Ql: REACTIVE — AB
RPR Titer: 1:1 {titer}

## 2020-02-26 LAB — GC/CHLAMYDIA PROBE AMP (~~LOC~~) NOT AT ARMC
Chlamydia: NEGATIVE
Comment: NEGATIVE
Comment: NORMAL
Neisseria Gonorrhea: NEGATIVE

## 2020-02-26 NOTE — Telephone Encounter (Signed)
Transition Care Management Follow-up Telephone Call  Date of discharge and from where: 02/25/20 from [facility]  How have you been since you were released from the hospital? Pt states that she is feeling okay today and that she does not have any concerns or questions at this time.   Any questions or concerns? No  Items Reviewed:  Did the pt receive and understand the discharge instructions provided? Yes   Medications obtained and verified? Yes   Other? No   Any new allergies since your discharge? No   Dietary orders reviewed? n/a  Do you have support at home? Yes   Functional Questionnaire: (I = Independent and D = Dependent) ADLs: I  Bathing/Dressing- I  Meal Prep- I  Eating- I  Maintaining continence- I  Transferring/Ambulation- I  Managing Meds- I  Follow up appointments reviewed:   PCP Hospital f/u appt confirmed? Yes  Pt states that she will call during her break today to make an appointment.   Are transportation arrangements needed? No   If their condition worsens, is the pt aware to call PCP or go to the Emergency Dept.? Yes  Was the patient provided with contact information for the PCP's office or ED? Yes  Was to pt encouraged to call back with questions or concerns? Yes

## 2020-02-27 LAB — T.PALLIDUM AB, TOTAL: T Pallidum Abs: NONREACTIVE

## 2020-05-22 ENCOUNTER — Ambulatory Visit: Payer: Medicaid Other | Admitting: Student

## 2020-06-06 ENCOUNTER — Ambulatory Visit (INDEPENDENT_AMBULATORY_CARE_PROVIDER_SITE_OTHER): Payer: Medicaid Other

## 2020-06-06 VITALS — BP 145/77 | HR 71 | Ht 66.0 in | Wt 222.3 lb

## 2020-06-06 DIAGNOSIS — O3680X0 Pregnancy with inconclusive fetal viability, not applicable or unspecified: Secondary | ICD-10-CM

## 2020-06-06 DIAGNOSIS — Z3401 Encounter for supervision of normal first pregnancy, first trimester: Secondary | ICD-10-CM

## 2020-06-06 DIAGNOSIS — Z3403 Encounter for supervision of normal first pregnancy, third trimester: Secondary | ICD-10-CM | POA: Insufficient documentation

## 2020-06-06 MED ORDER — BLOOD PRESSURE KIT DEVI: 1.0000 | 0 refills | Status: DC

## 2020-06-06 NOTE — Progress Notes (Signed)
New OB Intake  I connected with  Amy Elliott on 06/06/20 at  9:30 AM EDT by in office and verified that I am speaking with the correct person using two identifiers. Nurse is located at [facility] and pt is located at in office.  I discussed the limitations, risks, security and privacy concerns of performing an evaluation and management service by telephone and the availability of in person appointments. I also discussed with the patient that there may be a patient responsible charge related to this service. The patient expressed understanding and agreed to proceed.  I explained I am completing New OB Intake today. We discussed her EDD of 12/28/20 that is based on LMP of 03/23/20. Pt is G1/P0. I reviewed her allergies, medications, Medical/Surgical/OB history, and appropriate screenings. I informed her of [facility] services. Based on history, this is a/an uncomplicated pregnancy.  Patient Active Problem List   Diagnosis Date Noted  . Encounter for supervision of normal first pregnancy in first trimester 06/06/2020  . Prediabetes 03/17/2018  . Moderate episode of recurrent major depressive disorder (HCC) 04/07/2017  . GAD (generalized anxiety disorder) 04/07/2017  . Sleep difficulties 04/07/2017  . Morbid obesity (HCC) 08/09/2016    Concerns addressed today  Delivery Plans:  Plans to deliver at [facility].   MyChart/Babyscripts MyChart access verified. I explained pt will have some visits in office and some virtually. Babyscripts instructions given and order placed. Patient verifies receipt of registration text/e-mail. Account successfully created and app downloaded.  Blood Pressure Cuff Blood pressure cuff ordered for patient to pick-up from [employer]. Explained after first prenatal appt pt will check weekly and document in Babyscripts.  Anatomy US Explained first scheduled [place] will be around 19 weeks. Dating and Viability scan performed today  Labs Discussed Avelina Laine genetic screening  with patient. Would like both Panorama and Horizon drawn at new OB visit. Routine prenatal labs needed.  Covid Vaccine Patient has not covid vaccine.   Social Determinants of Health . Food Insecurity: Patient denies food insecurity. . WIC Referral: Patient is interested in referral to [facility].  . Transportation: Patient denies transportation needs. . Childcare: Discussed no children allowed at ultrasound appointments. Offered childcare services; patient declines childcare services at this time.  First visit review I reviewed new OB appt with pt. I explained she will have a pelvic exam, ob bloodwork with genetic screening, and PAP smear. Explained pt will be seen by Gerrit Heck at first visit; encounter routed to appropriate provider. Explained that patient will be seen by pregnancy navigator following visit with provider.  Hamilton Capri, RN 06/06/2020  9:33 AM

## 2020-06-06 NOTE — Progress Notes (Signed)
Agree with A & P. 

## 2020-06-19 ENCOUNTER — Other Ambulatory Visit: Payer: Self-pay

## 2020-06-19 ENCOUNTER — Ambulatory Visit (INDEPENDENT_AMBULATORY_CARE_PROVIDER_SITE_OTHER): Payer: Medicaid Other

## 2020-06-19 ENCOUNTER — Other Ambulatory Visit (HOSPITAL_COMMUNITY)
Admission: RE | Admit: 2020-06-19 | Discharge: 2020-06-19 | Disposition: A | Discharge status: Home / Self Care | Payer: Medicaid Other | Source: Ambulatory Visit

## 2020-06-19 VITALS — BP 121/67 | HR 61 | Wt 223.0 lb

## 2020-06-19 DIAGNOSIS — Z124 Encounter for screening for malignant neoplasm of cervix: Secondary | ICD-10-CM | POA: Insufficient documentation

## 2020-06-19 DIAGNOSIS — Z3A12 12 weeks gestation of pregnancy: Secondary | ICD-10-CM

## 2020-06-19 DIAGNOSIS — Z3401 Encounter for supervision of normal first pregnancy, first trimester: Secondary | ICD-10-CM

## 2020-06-19 DIAGNOSIS — O99210 Obesity complicating pregnancy, unspecified trimester: Secondary | ICD-10-CM

## 2020-06-19 DIAGNOSIS — Z131 Encounter for screening for diabetes mellitus: Secondary | ICD-10-CM | POA: Diagnosis not present

## 2020-06-19 DIAGNOSIS — B9689 Other specified bacterial agents as the cause of diseases classified elsewhere: Secondary | ICD-10-CM

## 2020-06-19 DIAGNOSIS — F411 Generalized anxiety disorder: Secondary | ICD-10-CM

## 2020-06-19 DIAGNOSIS — N760 Acute vaginitis: Secondary | ICD-10-CM

## 2020-06-19 DIAGNOSIS — O99019 Anemia complicating pregnancy, unspecified trimester: Secondary | ICD-10-CM

## 2020-06-19 NOTE — Progress Notes (Signed)
NOB 12.4wk Labs pended. No complaints. Depression/ anxiety screen:3/4

## 2020-06-19 NOTE — Patient Instructions (Signed)

## 2020-06-19 NOTE — Progress Notes (Signed)
Subjective:   Amy Elliott is a 20 y.o. G1P0 at 75w4dby Definite LMP being seen today for her first obstetrical visit.    Gynecological/Obstetrical History: Her obstetrical history is significant for obesity. Patient intends to bottle and breast feed. Pregnancy history fully reviewed. Patient reports nausea and constipation, but both are manageable .  Sexual Activity and Vaginal Concerns: She is currently sexually active and denies pain or discomfort during sex.  Patient reports increased vaginal discharge, but no irritation, odor, or bleeding.   Medical History/ROS: Patient with history of abnormal HgbA1C, anxiety and depression. Patient with some elevated blood pressures, but no formal diagnosis of HTN.  Patient denies medical history significant for other cardiovascular, respiratory, gastrointestinal, or hematological disorders.   Social History: Patient denies history or current usage of tobacco or alcohol.  Patient reports the FOB is RLynden Oxfordwho is involved and supportive, but not present due to work obligations.  Patient reports that she lives with RBerline Choughand his brothers and sister-in-law and endorses safety at home.  Patient denies DV/A. Patient is not currently employed.  HISTORY: OB History  Gravida Para Term Preterm AB Living  1 0 0 0 0 0  SAB IAB Ectopic Multiple Live Births  0 0 0 0 0    # Outcome Date GA Lbr Len/2nd Weight Sex Delivery Anes PTL Lv  1 Current             Last pap smear was done Pending and was  Pending  No past medical history on file. Past Surgical History:  Procedure Laterality Date   WISDOM TOOTH EXTRACTION  2020   Family History  Problem Relation Age of Onset   Hypertension Mother    Diabetes Maternal Grandfather    Social History   Tobacco Use   Smoking status: Never   Smokeless tobacco: Never  Vaping Use   Vaping Use: Never used  Substance Use Topics   Alcohol use: Not Currently    Comment: not since confirmed  pregnancy. Last drink in January   Drug use: Not Currently    Types: Marijuana    Comment: last used March   Allergies  Allergen Reactions   Banana    Other Itching    Grapes with seeds   Current Outpatient Medications on File Prior to Visit  Medication Sig Dispense Refill   acetaminophen (TYLENOL) 500 MG tablet Take 1,000 mg by mouth every 6 (six) hours as needed for headache.     Blood Pressure Monitoring (BLOOD PRESSURE KIT) DEVI 1 kit by Does not apply route once a week. 1 each 0   Prenatal Vit-Fe Fumarate-FA (MULTIVITAMIN-PRENATAL) 27-0.8 MG TABS tablet Take 1 tablet by mouth daily at 12 noon.     No current facility-administered medications on file prior to visit.    Review of Systems Pertinent items noted in HPI and remainder of comprehensive ROS otherwise negative.  Exam   Vitals:   06/19/20 1348  BP: 121/67  Pulse: 61  Weight: 223 lb (101.2 kg)      Physical Exam Constitutional:      Appearance: Normal appearance.  Genitourinary:     Vulva normal.     Vaginal discharge (Small amt thin white discharge) present.     No vaginal bleeding.     Vaginal exam comments: CV collected..     No cervical friability, lesion, polyp or nabothian cyst.     Cervical exam comments: Pap collected with brush and spatula.  .Marland Kitchen  HENT:     Elliott: Normocephalic and atraumatic.  Eyes:     Conjunctiva/sclera: Conjunctivae normal.  Cardiovascular:     Rate and Rhythm: Normal rate and regular rhythm.  Pulmonary:     Effort: Pulmonary effort is normal. No respiratory distress.     Breath sounds: Normal breath sounds.  Abdominal:     General: Bowel sounds are normal.     Tenderness: There is no abdominal tenderness.  Musculoskeletal:        General: Normal range of motion.     Cervical back: Normal range of motion.  Neurological:     Mental Status: She is alert and oriented to person, place, and time.  Skin:    General: Skin is warm and dry.  Psychiatric:        Mood and  Affect: Mood normal.        Behavior: Behavior normal.        Thought Content: Thought content normal.  Vitals reviewed. Exam conducted with a chaperone present.    Assessment:   20 y.o. year old G1P0 Patient Active Problem List   Diagnosis Date Noted   Encounter for supervision of normal first pregnancy in first trimester 06/06/2020   Prediabetes 03/17/2018   Moderate episode of recurrent major depressive disorder ([place]) 04/07/2017   GAD (generalized anxiety disorder) 04/07/2017   Sleep difficulties 04/07/2017   Morbid obesity ([place]) 08/09/2016     Plan:  1. Encounter for supervision of normal first pregnancy in first trimester -Congratulations given and patient welcomed to practice. -Reviewed prenatal visit schedule and platforms used for virtual visits.  -Anticipatory guidance for prenatal visits including labs, ultrasounds, and testing; Initial labs drawn. -Genetic Screening discussed, First trimester screen, Quad screen, and NIPS: ordered. -Encouraged to complete MyChart Registration for her ability to review results, send requests, and have questions addressed.  -Discussed estimated due date of December 28, 2020. -Ultrasound discussed; fetal anatomic survey: ordered. -Encouraged to seek out care at office or emergency room for urgent and/or emergent concerns. -Educated on the nature of [place] with multiple MDs and other Advanced Practice Providers was explained to patient; also emphasized that residents, students are part of our team. Informed of her right to refuse care as she deems appropriate.  -No questions or concerns.   2. Routine cervical smear -Pap smear collected and pending. -Educated on ASCCP guidelines regarding pap smear evaluation and frequency. -Informed of turnover time and provider/clinic policy on releasing results.  3. Generalized anxiety disorder -Reviewed history. -Informed of availably and recommendation for  referral with IBH. -Patient agreeable and referral placed.   4. [redacted] weeks gestation of pregnancy -Reviewed risk factors and research and recommendations for aspirin regimen for PreEclampsia risk.  -Plan to start regimen after 13 weeks  5. Obesity in pregnancy, antepartum -BMI 35.99 -CMP and HgB A1C Ordered -Reviewed weight gain of 10-25 lbs. -Discussed availability of dietitian if needed.   Problem list reviewed and updated. Routine obstetric precautions reviewed.  No orders of the defined types were placed in this encounter.   No follow-ups on file.     Maryann Conners, CNM 06/19/2020 2:25 PM

## 2020-06-20 LAB — OBSTETRIC PANEL, INCLUDING HIV
Antibody Screen: NEGATIVE
Basophils Absolute: 0 10*3/uL (ref 0.0–0.2)
Basos: 0 %
EOS (ABSOLUTE): 0.1 10*3/uL (ref 0.0–0.4)
Eos: 1 %
HIV Screen 4th Generation wRfx: NONREACTIVE
Hematocrit: 31.3 % — ABNORMAL LOW (ref 34.0–46.6)
Hemoglobin: 10.6 g/dL — ABNORMAL LOW (ref 11.1–15.9)
Hepatitis B Surface Ag: NEGATIVE
Immature Grans (Abs): 0 10*3/uL (ref 0.0–0.1)
Immature Granulocytes: 0 %
Lymphocytes Absolute: 2.2 10*3/uL (ref 0.7–3.1)
Lymphs: 35 %
MCH: 29 pg (ref 26.6–33.0)
MCHC: 33.9 g/dL (ref 31.5–35.7)
MCV: 86 fL (ref 79–97)
Monocytes Absolute: 0.5 10*3/uL (ref 0.1–0.9)
Monocytes: 8 %
Neutrophils Absolute: 3.6 10*3/uL (ref 1.4–7.0)
Neutrophils: 56 %
Platelets: 277 10*3/uL (ref 150–450)
RBC: 3.66 x10E6/uL — ABNORMAL LOW (ref 3.77–5.28)
RDW: 12.8 % (ref 11.7–15.4)
RPR Ser Ql: NONREACTIVE
Rh Factor: POSITIVE
Rubella Antibodies, IGG: 1.68 index (ref >0.99)
WBC: 6.3 10*3/uL (ref 3.4–10.8)

## 2020-06-20 LAB — HEMOGLOBIN A1C
Est. average glucose Bld gHb Est-mCnc: 100 mg/dL
Hgb A1c MFr Bld: 5.1 % (ref 4.8–5.6)

## 2020-06-20 LAB — CERVICOVAGINAL ANCILLARY ONLY
Bacterial Vaginitis (gardnerella): POSITIVE — AB
Candida Glabrata: NEGATIVE
Candida Vaginitis: NEGATIVE
Chlamydia: NEGATIVE
Comment: NEGATIVE
Comment: NEGATIVE
Comment: NEGATIVE
Comment: NEGATIVE
Comment: NEGATIVE
Comment: NORMAL
Neisseria Gonorrhea: NEGATIVE
Trichomonas: NEGATIVE

## 2020-06-21 LAB — CULTURE, OB URINE

## 2020-06-21 LAB — URINE CULTURE, OB REFLEX: Organism ID, Bacteria: NO GROWTH

## 2020-06-21 MED ORDER — ASPIRIN EC 81 MG PO TBEC: 81.0000 mg | DELAYED_RELEASE_TABLET | Freq: Every day | ORAL | 2 refills | Status: DC

## 2020-06-22 DIAGNOSIS — O99210 Obesity complicating pregnancy, unspecified trimester: Secondary | ICD-10-CM | POA: Insufficient documentation

## 2020-06-22 DIAGNOSIS — O99019 Anemia complicating pregnancy, unspecified trimester: Secondary | ICD-10-CM | POA: Insufficient documentation

## 2020-06-22 MED ORDER — METRONIDAZOLE 500 MG PO TABS: 500.0000 mg | ORAL_TABLET | Freq: Two times a day (BID) | ORAL | 0 refills | Status: DC

## 2020-06-22 MED ORDER — FERROUS SULFATE 325 (65 FE) MG PO TBEC: 325.0000 mg | DELAYED_RELEASE_TABLET | ORAL | 1 refills | Status: DC

## 2020-06-23 ENCOUNTER — Encounter: Payer: Self-pay | Admitting: Medical

## 2020-06-23 DIAGNOSIS — R87620 Atypical squamous cells of undetermined significance on cytologic smear of vagina (ASC-US): Secondary | ICD-10-CM | POA: Insufficient documentation

## 2020-06-23 LAB — CYTOLOGY - PAP: Diagnosis: UNDETERMINED — AB

## 2020-07-01 ENCOUNTER — Ambulatory Visit (INDEPENDENT_AMBULATORY_CARE_PROVIDER_SITE_OTHER): Payer: Medicaid Other | Admitting: Licensed Clinical Social Worker

## 2020-07-01 DIAGNOSIS — F4322 Adjustment disorder with anxiety: Secondary | ICD-10-CM | POA: Diagnosis not present

## 2020-07-01 NOTE — BH Specialist Note (Signed)
Integrated Behavioral Health via Telemedicine Visit  07/01/2020 Amy Elliott [id number]  Number of Integrated Behavioral Health visits: 1/6 Session Start time: 2:10pm  Session End time: 2:31pm Total time: 21 mins via phone due to poor connection   Referring Provider: Sabas Sous CNM Patient/Family location: Home  [facility] Provider location: [facility]  All persons participating in visit: Amy Elliott and LCSWA A. Felton Clinton  Types of Service: General Behavioral Integrated Care (BHI)  I connected with Amy Elliott and/or Amy Elliott  via  Telephone or [employer]  (Video is Surveyor, mining) and verified that I am speaking with the correct person using two identifiers. Discussed confidentiality: Yes   I discussed the limitations of telemedicine and the availability of in person appointments.  Discussed there is a possibility of technology failure and discussed alternative modes of communication if that failure occurs.  I discussed that engaging in this telemedicine visit, they consent to the provision of behavioral healthcare and the services will be billed under their insurance.  Patient and/or legal guardian expressed understanding and consented to Telemedicine visit: Yes   Presenting Concerns: Patient and/or family reports the following symptoms/concerns: anxiety  Duration of problem: approx 2 months ; Severity of problem: mild  Patient and/or Family's Strengths/Protective Factors: Concrete supports in place (healthy food, safe environments, etc.)  Goals Addressed: Patient will:  Reduce symptoms of: anxiety   Increase knowledge and/or ability of: coping skills   Demonstrate ability to: Increase healthy adjustment to current life circumstances  Progress towards Goals: Ongoing  Interventions: Interventions utilized:  Supportive Counseling Standardized Assessments completed: PHQ 9   Assessment: Patient currently experiencing Adjustment disorder with anxiety  .   Patient may benefit from Integrated behavioral health .  Plan: Follow up with behavioral health clinician on : 4 weeks via mychart  Behavioral recommendations: Keep scheduled WIC appt, engage in mindfulness technique to alleviate anxiety, communicate needs with support person and prioritize rest Referral(s): WIC appt 07/16/2020 at 245pm  I discussed the assessment and treatment plan with the patient and/or parent/guardian. They were provided an opportunity to ask questions and all were answered. They agreed with the plan and demonstrated an understanding of the instructions.   They were advised to call back or seek an in-person evaluation if the symptoms worsen or if the condition fails to improve as anticipated.  Amy Saxon, LCSW

## 2020-07-17 ENCOUNTER — Other Ambulatory Visit: Payer: Self-pay

## 2020-07-17 ENCOUNTER — Ambulatory Visit (INDEPENDENT_AMBULATORY_CARE_PROVIDER_SITE_OTHER): Payer: Medicaid Other | Admitting: Advanced Practice Midwife

## 2020-07-17 VITALS — BP 118/72 | HR 73 | Wt 225.4 lb

## 2020-07-17 DIAGNOSIS — O99210 Obesity complicating pregnancy, unspecified trimester: Secondary | ICD-10-CM

## 2020-07-17 DIAGNOSIS — Z3401 Encounter for supervision of normal first pregnancy, first trimester: Secondary | ICD-10-CM | POA: Diagnosis not present

## 2020-07-17 DIAGNOSIS — F411 Generalized anxiety disorder: Secondary | ICD-10-CM

## 2020-07-17 NOTE — Patient Instructions (Signed)
?  Considering Waterbirth? ?Guide for patients at [facility] ([facility]) ?Why consider waterbirth? ?Gentle birth for babies  ?Less pain medicine used in labor  ?May allow for passive descent/less pushing  ?May reduce perineal tears  ?More mobility and instinctive maternal position changes  ?Increased maternal relaxation  ? ?Is waterbirth safe? What are the risks of infection, drowning or other complications? ?Infection:  ?Very low risk (3.7 % for tub vs 4.8% for bed)  ?7 in 8000 waterbirths with documented infection  ?Poorly cleaned equipment most common cause  ?Slightly lower group B strep transmission rate  ?Drowning  ?Maternal:  ?Very low risk  ?Related to seizures or fainting  ?Newborn:  ?Very low risk. No evidence of increased risk of respiratory problems in multiple large studies  ?Physiological protection from breathing under water  ?Avoid underwater birth if there are any fetal complications  ?Once baby's head is out of the water, keep it out.  ?Birth complication  ?Some reports of cord trauma, but risk decreased by bringing baby to surface gradually  ?No evidence of increased risk of shoulder dystocia. Mothers can usually change positions faster in water than in a bed, possibly aiding the maneuvers to free the shoulder.  ? ?There are 2 things you MUST do to have a waterbirth with [facility]: ?Attend a waterbirth class at [facility]   ?3rd Wednesday of every month from 7-9 pm (virtual during COVID) ?Free ?Register online at [email or web address] or [email or web address] or by calling [phone] ?Bring us the certificate from the class to your prenatal appointment or send via MyChart ?Meet with a midwife at 36 weeks* to see if you can still plan a waterbirth and to sign the consent.  ? ?*We also recommend that you schedule as many of your prenatal visits with a midwife as possible.   ? ?Helpful information: ?You may want to bring a bathing suit top to the hospital  to wear during labor but this is optional.  All other supplies are provided by the hospital. ?Please arrive at the hospital with signs of active labor, and do not wait at home until late in labor. It takes 45 min- 2 hours for COVID testing, fetal monitoring, and check in to your room to take place, plus transport and filling of the waterbirth tub.   ? ?Things that would prevent you from having a waterbirth: ?Unknown or Positive COVID-19 diagnosis upon admission to hospital* ?Premature, <37wks  ?Previous cesarean birth  ?Presence of thick meconium-stained fluid  ?Multiple gestation (Twins, triplets, etc.)  ?Uncontrolled diabetes or gestational diabetes requiring medication  ?Hypertension diagnosed in pregnancy or preexisting hypertension (gestational hypertension, preeclampsia, or chronic hypertension) ?Fetal growth restriction (your baby measures less than 10th percentile on ultrasound) ?Heavy vaginal bleeding  ?Non-reassuring fetal heart rate  ?Active infection (MRSA, etc.). Group B Strep is NOT a contraindication for waterbirth.  ?If your labor has to be induced and induction method requires continuous monitoring of the baby's heart rate  ?Other risks/issues identified by your obstetrical provider  ? ?Please remember that birth is unpredictable. Under certain unforeseeable circumstances your provider may advise against giving birth in the tub. These decisions will be made on a case-by-case basis and with the safety of you and your baby as our highest priority. ? ? ?*Please remember that in order to have a waterbirth, you must test Negative to COVID-19 upon admission to the hospital. ? ?Updated 04/21/20 ? ?

## 2020-07-17 NOTE — Progress Notes (Signed)
   PRENATAL VISIT NOTE  Subjective:  Amy Elliott is a 20 y.o. G1P0 at [redacted]w[redacted]d being seen today for ongoing prenatal care.  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  Patient reports no complaints.  Contractions: Not present. Vag. Bleeding: None.  Movement: Present. Denies leaking of fluid.   The following portions of the patient's history were reviewed and updated as appropriate: allergies, current medications, past family history, past medical history, past social history, past surgical history and problem list.   Objective:   Vitals:   07/17/20 0952  BP: 118/72  Pulse: 73  Weight: 225 lb 6.4 oz (102.2 kg)    Fetal Status: Fetal Heart Rate (bpm): 154   Movement: Present     General:  Alert, oriented and cooperative. Patient is in no acute distress.  Skin: Skin is warm and dry. No rash noted.   Cardiovascular: Normal heart rate noted  Respiratory: Normal respiratory effort, no problems with respiration noted  Abdomen: Soft, gravid, appropriate for gestational age.  Pain/Pressure: Absent     Pelvic: Cervical exam deferred        Extremities: Normal range of motion.  Edema: None  Mental Status: Normal mood and affect. Normal behavior. Normal judgment and thought content.   Assessment and Plan:  Pregnancy: G1P0 at [redacted]w[redacted]d 1. Encounter for supervision of normal first pregnancy in first trimester --Anticipatory guidance about next visits/weeks of pregnancy given. --Interested in waterbirth, questions answered. Pt to enroll in class, see  midwives for most visits. Discussed waterbirth as option for low risk pregnancy. Pregnancy is hard to predict and things may arise that prevent immersion in water but labor can be supported with freedom of movement, birthing ball, shower and birth can be supported in position of pt choosing, etc, even if waterbirth becomes unavailable. Pt states understanding.  --next visit in 4 weeks  - AFP, Serum, Open Spina Bifida  2. Obesity in pregnancy, antepartum --Pt has not started BASA, to start today  3. Generalized anxiety disorder --Doing well, good support.  Visit with Sue Lush 6/21  Preterm labor symptoms and general obstetric precautions including but not limited to vaginal bleeding, contractions, leaking of fluid and fetal movement were reviewed in detail with the patient. Please refer to After Visit Summary for other counseling recommendations.   Return in about 4 weeks (around 08/14/2020).  Future Appointments  Date Time Provider Department Center  08/05/2020 10:30 AM [facility] NURSE [facility]  08/05/2020 10:45 AM [facility] US5 [facility]    Sharen Counter, CNM

## 2020-07-19 LAB — AFP, SERUM, OPEN SPINA BIFIDA
AFP MoM: 1.1
AFP Value: 33.1 ng/mL
Gest. Age on Collection Date: 16 weeks
Maternal Age At EDD: 21.8 yr
OSBR Risk 1 IN: 10000
Test Results:: NEGATIVE
Weight: 225 [lb_av]

## 2020-07-20 ENCOUNTER — Other Ambulatory Visit: Payer: Self-pay

## 2020-07-20 ENCOUNTER — Encounter (HOSPITAL_COMMUNITY): Payer: Self-pay | Admitting: Emergency Medicine

## 2020-07-20 ENCOUNTER — Emergency Department (HOSPITAL_COMMUNITY)
Admission: EM | Admit: 2020-07-20 | Discharge: 2020-07-21 | Disposition: A | Discharge status: Home / Self Care | Payer: Medicaid Other | Attending: Emergency Medicine | Admitting: Emergency Medicine

## 2020-07-20 DIAGNOSIS — R519 Headache, unspecified: Secondary | ICD-10-CM | POA: Insufficient documentation

## 2020-07-20 DIAGNOSIS — Z5321 Procedure and treatment not carried out due to patient leaving prior to being seen by health care provider: Secondary | ICD-10-CM | POA: Diagnosis not present

## 2020-07-20 NOTE — ED Triage Notes (Signed)
Pt states she is concerned about ongoing headache onset today, tylenol with relief. Rates pain 2/10. And concerns for elevated BP. Home BP 156/72. No other symptoms.

## 2020-07-21 NOTE — ED Notes (Signed)
Called to room pt x3, no response. 

## 2020-08-05 ENCOUNTER — Ambulatory Visit: Payer: Medicaid Other | Admitting: *Deleted

## 2020-08-05 ENCOUNTER — Encounter: Payer: Self-pay | Admitting: *Deleted

## 2020-08-05 ENCOUNTER — Other Ambulatory Visit: Payer: Self-pay | Admitting: *Deleted

## 2020-08-05 ENCOUNTER — Other Ambulatory Visit: Payer: Self-pay

## 2020-08-05 ENCOUNTER — Ambulatory Visit: Payer: Medicaid Other

## 2020-08-05 VITALS — BP 125/56 | HR 67

## 2020-08-05 DIAGNOSIS — Z3401 Encounter for supervision of normal first pregnancy, first trimester: Secondary | ICD-10-CM | POA: Diagnosis not present

## 2020-08-05 DIAGNOSIS — O99210 Obesity complicating pregnancy, unspecified trimester: Secondary | ICD-10-CM

## 2020-08-05 DIAGNOSIS — Z6841 Body Mass Index (BMI) 40.0 and over, adult: Secondary | ICD-10-CM

## 2020-08-06 ENCOUNTER — Other Ambulatory Visit: Payer: Self-pay | Admitting: Obstetrics

## 2020-08-06 MED ORDER — VITAFOL ULTRA 29-0.6-0.4-200 MG PO CAPS: 1.0000 | ORAL_CAPSULE | Freq: Every day | ORAL | 6 refills | Status: DC

## 2020-08-12 ENCOUNTER — Telehealth: Payer: Self-pay

## 2020-08-12 NOTE — Telephone Encounter (Signed)
Message received from baby scripts. Patient had an elevated blood pressure today. Call patient, no answer or voice mail to leave a message.

## 2020-08-18 ENCOUNTER — Other Ambulatory Visit: Payer: Self-pay

## 2020-08-18 ENCOUNTER — Ambulatory Visit (INDEPENDENT_AMBULATORY_CARE_PROVIDER_SITE_OTHER): Payer: Medicaid Other | Admitting: Advanced Practice Midwife

## 2020-08-18 VITALS — BP 117/70 | HR 69 | Wt 238.0 lb

## 2020-08-18 DIAGNOSIS — Z3A21 21 weeks gestation of pregnancy: Secondary | ICD-10-CM

## 2020-08-18 DIAGNOSIS — Z3401 Encounter for supervision of normal first pregnancy, first trimester: Secondary | ICD-10-CM

## 2020-08-18 NOTE — Patient Instructions (Signed)
Register for waterbirth and other pregnancy classes: [email or web address]

## 2020-08-18 NOTE — Progress Notes (Signed)
   PRENATAL VISIT NOTE  Subjective:  Amy Elliott is a 20 y.o. G1P0 at [redacted]w[redacted]d being seen today for ongoing prenatal care.  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  Patient reports no complaints.  Contractions: Not present. Vag. Bleeding: None.  Movement: Present. Denies leaking of fluid.   The following portions of the patient's history were reviewed and updated as appropriate: allergies, current medications, past family history, past medical history, past social history, past surgical history and problem list.   Objective:   Vitals:   08/18/20 0954  BP: 117/70  Pulse: 69  Weight: 238 lb (108 kg)    Fetal Status: Fetal Heart Rate (bpm): 145   Movement: Present     General:  Alert, oriented and cooperative. Patient is in no acute distress.  Skin: Skin is warm and dry. No rash noted.   Cardiovascular: Normal heart rate noted  Respiratory: Normal respiratory effort, no problems with respiration noted  Abdomen: Soft, gravid, appropriate for gestational age.  Pain/Pressure: Absent     Pelvic: Cervical exam deferred        Extremities: Normal range of motion.  Edema: None  Mental Status: Normal mood and affect. Normal behavior. Normal judgment and thought content.   Assessment and Plan:  Pregnancy: G1P0 at [redacted]w[redacted]d 1. Encounter for supervision of normal first pregnancy in first trimester --Anticipatory guidance about next visits/weeks of pregnancy given. --Next visit in 4 weeks  2. [redacted] weeks gestation of pregnancy  Preterm labor symptoms  and general obstetric precautions including but not limited to vaginal bleeding, contractions, leaking of fluid and fetal movement were reviewed in detail with the patient. Please refer to After Visit Summary for other counseling recommendations.   No follow-ups on file.  Future Appointments  Date Time Provider Department Center  09/09/2020  9:30 AM [facility] NURSE [facility]  09/09/2020  9:45 AM [facility] US5 [facility]    Sharen Counter, CNM

## 2020-08-18 NOTE — Progress Notes (Signed)
ROB 21.1 wks No complaints.

## 2020-09-03 ENCOUNTER — Encounter: Payer: Self-pay | Admitting: *Deleted

## 2020-09-09 ENCOUNTER — Other Ambulatory Visit: Payer: Self-pay | Admitting: *Deleted

## 2020-09-09 ENCOUNTER — Ambulatory Visit: Payer: Medicaid Other | Attending: Obstetrics

## 2020-09-09 ENCOUNTER — Other Ambulatory Visit: Payer: Self-pay

## 2020-09-09 ENCOUNTER — Ambulatory Visit: Payer: Medicaid Other | Admitting: *Deleted

## 2020-09-09 ENCOUNTER — Encounter: Payer: Self-pay | Admitting: *Deleted

## 2020-09-09 VITALS — BP 119/56 | HR 76

## 2020-09-09 DIAGNOSIS — Z362 Encounter for other antenatal screening follow-up: Secondary | ICD-10-CM | POA: Diagnosis not present

## 2020-09-09 DIAGNOSIS — O99012 Anemia complicating pregnancy, second trimester: Secondary | ICD-10-CM | POA: Insufficient documentation

## 2020-09-09 DIAGNOSIS — E669 Obesity, unspecified: Secondary | ICD-10-CM

## 2020-09-09 DIAGNOSIS — Z3A24 24 weeks gestation of pregnancy: Secondary | ICD-10-CM

## 2020-09-09 DIAGNOSIS — O99212 Obesity complicating pregnancy, second trimester: Secondary | ICD-10-CM

## 2020-09-09 DIAGNOSIS — Z6841 Body Mass Index (BMI) 40.0 and over, adult: Secondary | ICD-10-CM

## 2020-09-09 DIAGNOSIS — Z3401 Encounter for supervision of normal first pregnancy, first trimester: Secondary | ICD-10-CM

## 2020-09-16 ENCOUNTER — Encounter: Payer: Medicaid Other | Admitting: Women's Health

## 2020-09-16 ENCOUNTER — Encounter: Payer: Self-pay | Admitting: Obstetrics & Gynecology

## 2020-09-16 ENCOUNTER — Ambulatory Visit (INDEPENDENT_AMBULATORY_CARE_PROVIDER_SITE_OTHER): Payer: Medicaid Other | Admitting: Obstetrics & Gynecology

## 2020-09-16 ENCOUNTER — Other Ambulatory Visit: Payer: Self-pay

## 2020-09-16 DIAGNOSIS — Z3401 Encounter for supervision of normal first pregnancy, first trimester: Secondary | ICD-10-CM

## 2020-09-16 NOTE — Progress Notes (Signed)
ROB [redacted]w[redacted]d

## 2020-09-16 NOTE — Progress Notes (Signed)
   PRENATAL VISIT NOTE  Subjective:  Amy Elliott is a 20 y.o. G1P0000 at [redacted]w[redacted]d being seen today for ongoing prenatal care.  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  Patient reports no complaints.  Contractions: Not present. Vag. Bleeding: None.  Movement: Present. Denies leaking of fluid.   The following portions of the patient's history were reviewed and updated as appropriate: allergies, current medications, past family history, past medical history, past social history, past surgical history and problem list.   Objective:   Vitals:   09/16/20 1356  BP: 119/72  Pulse: 71  Weight: 249 lb 4.8 oz (113.1 kg)    Fetal Status: Fetal Heart Rate (bpm): 150   Movement: Present     General:  Alert, oriented and cooperative. Patient is in no acute distress.  Skin: Skin is warm and dry. No rash noted.   Cardiovascular: Normal heart rate noted  Respiratory: Normal respiratory effort, no problems with respiration noted  Abdomen: Soft, gravid, appropriate for gestational age.  Pain/Pressure: Absent     Pelvic: Cervical exam deferred        Extremities: Normal range of motion.  Edema: None  Mental Status: Normal mood and affect. Normal behavior. Normal judgment and thought content.   Assessment and Plan:  Pregnancy: G1P0000 at [redacted]w[redacted]d 1. Encounter for supervision of normal first pregnancy in first trimester Normal fetal growth on f/u US   Preterm labor symptoms and general obstetric precautions including but not limited to vaginal bleeding,  contractions, leaking of fluid and fetal movement were reviewed in detail with the patient. Please refer to After Visit Summary for other counseling recommendations.   Return in about 3 weeks (around 10/07/2020) for 2hr GTT.  Future Appointments  Date Time Provider Department Center  11/04/2020  9:30 AM [facility] NURSE [facility]  11/04/2020  9:45 AM [facility]    Scheryl Darter, MD

## 2020-09-16 NOTE — Progress Notes (Signed)
Pt reports fetal movement, denies pain.  

## 2020-10-07 ENCOUNTER — Other Ambulatory Visit: Payer: Self-pay

## 2020-10-07 DIAGNOSIS — O99019 Anemia complicating pregnancy, unspecified trimester: Secondary | ICD-10-CM

## 2020-10-08 ENCOUNTER — Ambulatory Visit (INDEPENDENT_AMBULATORY_CARE_PROVIDER_SITE_OTHER): Payer: Medicaid Other | Admitting: Obstetrics

## 2020-10-08 ENCOUNTER — Other Ambulatory Visit: Payer: Self-pay

## 2020-10-08 ENCOUNTER — Other Ambulatory Visit: Payer: Medicaid Other

## 2020-10-08 ENCOUNTER — Encounter: Payer: Self-pay | Admitting: Obstetrics

## 2020-10-08 VITALS — BP 127/75 | HR 65 | Wt 259.0 lb

## 2020-10-08 DIAGNOSIS — Z3483 Encounter for supervision of other normal pregnancy, third trimester: Secondary | ICD-10-CM

## 2020-10-08 DIAGNOSIS — Z3A28 28 weeks gestation of pregnancy: Secondary | ICD-10-CM | POA: Diagnosis not present

## 2020-10-08 NOTE — Progress Notes (Signed)
Subjective:  Amy Elliott is a 20 y.o. G1P0000 at [redacted]w[redacted]d being seen today for ongoing prenatal care.  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  Patient reports no complaints.   .  .   . Denies leaking of fluid.   The following portions of the patient's history were reviewed and updated as appropriate: allergies, current medications, past family history, past medical history, past social history, past surgical history and problem list. Problem list updated.  Objective:   Vitals:   10/08/20 1019  BP: 127/75  Pulse: 65  Weight: 259 lb (117.5 kg)    Fetal Status:           General:  Alert, oriented and cooperative. Patient is in no acute distress.  Skin: Skin is warm and dry. No rash noted.   Cardiovascular: Normal heart rate noted  Respiratory: Normal respiratory effort, no problems with respiration noted  Abdomen: Soft, gravid, appropriate for gestational age.       Pelvic:  Cervical exam deferred        Extremities: Normal range of motion.     Mental Status: Normal mood and affect. Normal behavior. Normal judgment and thought content.   Urinalysis:      Assessment and Plan:  Pregnancy: G1P0000 at [redacted]w[redacted]d  1. Encounter for supervision of other normal pregnancy in third trimester  Rx: - Glucose Tolerance, 2 Hours w/1 Hour - CBC - HIV Antibody (routine testing w rflx) - RPR  Preterm labor symptoms and general obstetric precautions including but not limited to vaginal bleeding, contractions, leaking of fluid and fetal movement were  reviewed in detail with the patient. Please refer to After Visit Summary for other counseling recommendations.   Return in about 2 weeks (around 10/22/2020) for ROB.   Brock Bad, MD  10/08/20

## 2020-10-09 ENCOUNTER — Other Ambulatory Visit: Payer: Self-pay | Admitting: Obstetrics

## 2020-10-09 DIAGNOSIS — Z3483 Encounter for supervision of other normal pregnancy, third trimester: Secondary | ICD-10-CM

## 2020-10-09 DIAGNOSIS — D508 Other iron deficiency anemias: Secondary | ICD-10-CM

## 2020-10-09 LAB — CBC
Hematocrit: 28.7 % — ABNORMAL LOW (ref 34.0–46.6)
Hemoglobin: 9.7 g/dL — ABNORMAL LOW (ref 11.1–15.9)
MCH: 29 pg (ref 26.6–33.0)
MCHC: 33.8 g/dL (ref 31.5–35.7)
MCV: 86 fL (ref 79–97)
Platelets: 264 10*3/uL (ref 150–450)
RBC: 3.34 x10E6/uL — ABNORMAL LOW (ref 3.77–5.28)
RDW: 11.9 % (ref 11.7–15.4)
WBC: 10.1 10*3/uL (ref 3.4–10.8)

## 2020-10-09 LAB — GLUCOSE TOLERANCE, 2 HOURS W/ 1HR
Glucose, 1 hour: 112 mg/dL (ref 65–179)
Glucose, 2 hour: 89 mg/dL (ref 65–152)
Glucose, Fasting: 80 mg/dL (ref 65–91)

## 2020-10-09 LAB — RPR: RPR Ser Ql: NONREACTIVE

## 2020-10-09 LAB — HIV ANTIBODY (ROUTINE TESTING W REFLEX): HIV Screen 4th Generation wRfx: NONREACTIVE

## 2020-10-09 MED ORDER — IRON POLYSACCH CMPLX-B12-FA 150-0.025-1 MG PO CAPS: 1.0000 | ORAL_CAPSULE | ORAL | 5 refills | Status: DC

## 2020-10-09 MED ORDER — VITAFOL ULTRA 29-0.6-0.4-200 MG PO CAPS: 1.0000 | ORAL_CAPSULE | Freq: Every day | ORAL | 3 refills | Status: DC

## 2020-10-10 ENCOUNTER — Encounter: Payer: Self-pay | Admitting: *Deleted

## 2020-10-10 NOTE — Progress Notes (Signed)
Patient is active in MyChart. Message regarding anemia and RX times 2 sent. Patient education on anemia in pregnancy included.

## 2020-10-22 ENCOUNTER — Other Ambulatory Visit: Payer: Self-pay

## 2020-10-22 ENCOUNTER — Encounter: Payer: Self-pay | Admitting: Obstetrics and Gynecology

## 2020-10-22 ENCOUNTER — Ambulatory Visit (INDEPENDENT_AMBULATORY_CARE_PROVIDER_SITE_OTHER): Payer: Medicaid Other | Admitting: Obstetrics and Gynecology

## 2020-10-22 VITALS — BP 125/72 | HR 79 | Wt 259.0 lb

## 2020-10-22 DIAGNOSIS — O99210 Obesity complicating pregnancy, unspecified trimester: Secondary | ICD-10-CM

## 2020-10-22 DIAGNOSIS — O99019 Anemia complicating pregnancy, unspecified trimester: Secondary | ICD-10-CM

## 2020-10-22 DIAGNOSIS — Z3A30 30 weeks gestation of pregnancy: Secondary | ICD-10-CM

## 2020-10-22 DIAGNOSIS — Z3400 Encounter for supervision of normal first pregnancy, unspecified trimester: Secondary | ICD-10-CM

## 2020-10-22 DIAGNOSIS — Z3401 Encounter for supervision of normal first pregnancy, first trimester: Secondary | ICD-10-CM

## 2020-10-22 NOTE — Progress Notes (Signed)
   LOW-RISK PREGNANCY OFFICE VISIT Patient name: Amy Elliott MRN [id number]  Date of birth: 12-03-2000 Chief Complaint:   Routine Prenatal Visit  History of Present Illness:   Amy Elliott is a 20 y.o. G69P0000 female at [redacted]w[redacted]d with an Estimated Date of Delivery: 12/28/20 being seen today for ongoing management of a low-risk pregnancy.  Today she reports occasional nausea after awakening from a nap or randomly out of the blue. Contractions: Not present. Vag. Bleeding: None.  Movement: Present. denies leaking of fluid. Review of Systems:   Pertinent items are noted in HPI Denies abnormal vaginal discharge w/ itching/odor/irritation, headaches, visual changes, shortness of breath, chest pain, abdominal pain, severe nausea/vomiting, or problems with urination or bowel movements unless otherwise stated above. Pertinent History Reviewed:  Reviewed past medical,surgical, social, obstetrical and family history.  Reviewed problem list, medications and allergies. Physical Assessment:   Vitals:   10/22/20 1339 10/22/20 1356  BP: 138/74 125/72  Pulse: 79 79  Weight: 259 lb (117.5 kg)   Body mass index is 41.8 kg/m.        Physical Examination:   General appearance: Well appearing, and in no distress  Mental status: Alert, oriented to person, place, and time  Skin: Warm & dry  Cardiovascular: Normal heart rate noted  Respiratory: Normal respiratory effort, no distress  Abdomen: Soft, gravid, nontender  Pelvic: Cervical exam deferred         Extremities: Edema: None  Fetal Status: Fetal Heart Rate (bpm): 141   Movement: Present    No results found for this or any previous visit (from the past 24 hour(s)).  Assessment & Plan:  1) Low-risk pregnancy G1P0000 at [redacted]w[redacted]d with an Estimated Date of Delivery: 12/28/20   2) Supervision of normal first pregnancy, antepartum - Advised to be mindful of what she is eating, making sure to increase and/or include more protein in her diet, and staying  well-hydrated - Call the office, if nausea does not improve with the said suggestions  3) Anemia of mother in pregnancy, antepartum - Taking iron as prescribed  4) Obesity in pregnancy, antepartum - Taking bASA daily  5) [redacted] weeks gestation of pregnancy    Meds: No orders of the defined types were placed in this encounter.  Labs/procedures today: none  Plan:  Continue routine obstetrical care   Reviewed: Preterm labor symptoms and general obstetric precautions including but not limited to vaginal bleeding, contractions, leaking of fluid and fetal movement were reviewed in detail with the patient.  All questions were answered. Has home bp cuff. Check bp weekly, let us know if >140/90.   Follow-up: Return in about 4 weeks (around 11/19/2020) for Return OB visit.  No orders of the defined types were placed in this encounter.  Raelyn Mora MSN, CNM 10/22/2020 1:43 PM

## 2020-11-04 ENCOUNTER — Ambulatory Visit: Payer: Medicaid Other

## 2020-11-04 ENCOUNTER — Other Ambulatory Visit: Payer: Self-pay

## 2020-11-04 ENCOUNTER — Ambulatory Visit: Payer: Medicaid Other | Attending: Obstetrics

## 2020-11-04 ENCOUNTER — Ambulatory Visit: Payer: Medicaid Other | Admitting: *Deleted

## 2020-11-04 ENCOUNTER — Encounter: Payer: Self-pay | Admitting: *Deleted

## 2020-11-04 DIAGNOSIS — O99013 Anemia complicating pregnancy, third trimester: Secondary | ICD-10-CM

## 2020-11-04 DIAGNOSIS — Z3A32 32 weeks gestation of pregnancy: Secondary | ICD-10-CM

## 2020-11-04 DIAGNOSIS — D649 Anemia, unspecified: Secondary | ICD-10-CM | POA: Diagnosis not present

## 2020-11-04 DIAGNOSIS — O99213 Obesity complicating pregnancy, third trimester: Secondary | ICD-10-CM | POA: Diagnosis not present

## 2020-11-19 ENCOUNTER — Other Ambulatory Visit: Payer: Self-pay

## 2020-11-19 ENCOUNTER — Ambulatory Visit (INDEPENDENT_AMBULATORY_CARE_PROVIDER_SITE_OTHER): Payer: Medicaid Other | Admitting: Women's Health

## 2020-11-19 ENCOUNTER — Encounter: Payer: Self-pay | Admitting: Women's Health

## 2020-11-19 VITALS — BP 131/73 | HR 77 | Wt 267.4 lb

## 2020-11-19 DIAGNOSIS — Z3403 Encounter for supervision of normal first pregnancy, third trimester: Secondary | ICD-10-CM

## 2020-11-19 DIAGNOSIS — F331 Major depressive disorder, recurrent, moderate: Secondary | ICD-10-CM

## 2020-11-19 DIAGNOSIS — O99210 Obesity complicating pregnancy, unspecified trimester: Secondary | ICD-10-CM

## 2020-11-19 DIAGNOSIS — F411 Generalized anxiety disorder: Secondary | ICD-10-CM

## 2020-11-19 DIAGNOSIS — R7303 Prediabetes: Secondary | ICD-10-CM

## 2020-11-19 DIAGNOSIS — O99019 Anemia complicating pregnancy, unspecified trimester: Secondary | ICD-10-CM

## 2020-11-19 DIAGNOSIS — Z3A34 34 weeks gestation of pregnancy: Secondary | ICD-10-CM

## 2020-11-19 NOTE — Patient Instructions (Addendum)
Maternity Assessment Unit (MAU)  The Maternity Assessment Unit (MAU) is located at the [facility]. The address is: [address], [address], [place], [place] [address]. Please see map below for additional directions.    The Maternity Assessment Unit is designed to help you during your pregnancy, and for up to 6 weeks after delivery, with any pregnancy- or postpartum-related emergencies, if you think you are in labor, or if your water has broken. For example, if you experience nausea and vomiting, vaginal bleeding, severe abdominal or pelvic pain, elevated blood pressure or other problems related to your pregnancy or postpartum time, please come to the Maternity Assessment Unit for assistance.       Preterm Labor The normal length of a pregnancy is 39-41 weeks. Preterm labor is when labor starts before 37 completed weeks of pregnancy. Babies who are born prematurely and survive may not be fully developed and may be at an increased risk for long-term problems such as cerebral palsy, developmental delays, and vision and hearing problems. Babies who are born too early may have problems soon after birth. Premature babies may have problems regulating blood sugar, body temperature, heart rate, and breathing rate. These babies often have trouble with feeding. The risk of having problems is highest for babies who are born before 34 weeks of pregnancy. What are the causes? The exact cause of this condition is not known. What increases the risk? You are more likely to have preterm labor if you have certain risk factors that relate to your medical history, problems with present and past pregnancies, and lifestyle factors. Medical history You have abnormalities of the uterus, including a short cervix. You have STIs (sexually transmitted infections) or other infections of the urinary tract and the vagina. You have chronic illnesses, such as blood clotting  problems, diabetes, or high blood pressure. You are overweight or underweight. Present and past pregnancies You have had preterm labor before. You are pregnant with twins or other multiples. You have been diagnosed with a condition in which the placenta covers your cervix (placenta previa). You waited less than 18 months between giving birth and becoming pregnant again. Your unborn baby has some abnormalities. You have vaginal bleeding during pregnancy. You became pregnant through in vitro fertilization (IVF). Lifestyle and environmental factors You use tobacco products or drink alcohol. You use drugs. You have stress and no social support. You experience domestic violence. You are exposed to certain chemicals or environmental pollutants. Other factors You are younger than age 17 or older than age 35. What are the signs or symptoms? Symptoms of this condition include: Cramps similar to those that can happen during a menstrual period. The cramps may happen with diarrhea. Pain in the abdomen or lower back. Regular contractions that may feel like tightening of the abdomen. A feeling of increased pressure in the pelvis. Increased watery or bloody mucus discharge from the vagina. Water breaking (ruptured amniotic sac). How is this diagnosed? This condition is diagnosed based on: Your medical history and a physical exam. A pelvic exam. An ultrasound. Monitoring your uterus for contractions. Other tests, including: A swab of the cervix to check for a chemical called fetal fibronectin. Urine tests. How is this treated? Treatment for this condition depends on the length of your pregnancy, your condition, and the health of your baby. Treatment may include: Taking medicines, such as: Hormone medicines. These may be given early in pregnancy to help support the pregnancy. Medicines to stop   contractions. Medicines to help mature the baby's lungs. These may be prescribed if the risk of  delivery is high. Medicines to help protect your baby from brain and nerve complications such as cerebral palsy. Bed rest. If the labor happens before 34 weeks of pregnancy, you may need to stay in the hospital. Delivery of the baby. Follow these instructions at home:  Do not use any products that contain nicotine or tobacco. These products include cigarettes, chewing tobacco, and vaping devices, such as e-cigarettes. If you need help quitting, ask your health care provider. Do not drink alcohol. Take over-the-counter and prescription medicines only as told by your health care provider. Rest as told by your health care provider. Return to your normal activities as told by your health care provider. Ask your health care provider what activities are safe for you. Keep all follow-up visits. This is important. How is this prevented? To increase your chance of having a full-term pregnancy: Do not use drugs or take medicines that have not been prescribed to you during your pregnancy. Talk with your health care provider before taking any herbal supplements, even if you have been taking them regularly. Make sure you gain a healthy amount of weight during your pregnancy. Watch for infection. If you think that you might have an infection, get it checked right away. Symptoms of infection may include: Fever. Abnormal vaginal discharge or discharge that smells bad. Pain or burning with urination. Needing to urinate urgently. Frequently urinating or passing small amounts of urine frequently. Blood in your urine or urine that smells bad or unusual. Where to find more information [place] Department of Health and Cytogeneticist on Women's Health: http://hoffman.com/ [employer]: www.acog.org Centers for Disease Control and Prevention, Preterm Birth: FootballExhibition.com.br Contact a health care provider if: You think you are going into preterm labor. You have signs  or symptoms of preterm labor. You have symptoms of infection. Get help right away if: You are having regular, painful contractions every 5 minutes or less. Your water breaks. Summary Preterm labor is labor that starts before you reach 37 weeks of pregnancy. Delivering your baby early increases your baby's risk of developing long-term problems. You are more likely to have preterm labor if you have certain risk factors that relate to your medical history, problems with present and past pregnancies, and lifestyle factors. Keep all follow-up visits. This is important. Contact a health care provider if you have signs or symptoms of preterm labor. This information is not intended to replace advice given to you by your health care provider. Make sure you discuss any questions you have with your health care provider. Document Revised: 01/01/2020 Document Reviewed: 01/01/2020 Elsevier Patient Education  2022 Else[employer] Inc[employer]       Perinatal Depression When a woman feels excessive sadness, anger, or anxiety during pregnancy or during the first 12 months after she gives birth, she has a condition called perinatal depression. This can interfere with work, school, relationships, and other everyday activities. If it is not managed properly, it can also interfere with the woman's ability to take care of the baby. Symptoms of perinatal depression may feel worse when living with a newborn. Sometimes, these symptoms are left untreated because they are thought to be normal mood swings during and right after pregnancy. However, if you have intense symptoms of depression that last for more than 2 weeks, it is important to talk with your health care provider. This may be perinatal depression.  What are the causes? The exact cause of this condition is not known. Hormonal changes during and after pregnancy may play a role in causing perinatal depression. What increases the risk? You are more likely to develop this  condition if: You have a personal or family history of depression, anxiety, or mood disorders. You experience a stressful life event during pregnancy, such as the death of a loved one. You have additional life stress, such as being a single parent. You do not have support from family members or loved ones, or you are in an abusive relationship. You have thyroid problems. What are the signs or symptoms? Symptoms of this condition include: Emotional symptoms, such as: Feeling sad or hopeless. Feelings of guilt. Feeling irritable or overwhelmed. Physical symptoms, such as: Changes with appetite or sleep. Lack of energy or motivation. Persistent headaches or stomach problems. Behavioral symptoms, such as: Difficulty concentrating or completing tasks. Loss of interest in hobbies or relationships. How is this diagnosed? This condition is diagnosed based on a physical exam and mental evaluation. In some cases, your health care provider may use a depression screening tool. This includes a list of questions that can help a health care provider diagnose depression. You may be referred to a mental health expert who specializes in treating perinatal depression. How is this treated? This condition may be treated with: Talk therapy with a mental health professional. This may be interpersonal psychotherapy, couples therapy, cognitive behavioral therapy, or mother-child bonding therapy. Medicines. Your health care provider will discuss the safety of the medicines prescribed during pregnancy and breastfeeding. Support groups. Brain stimulation or light therapies. Stress reduction therapies, such as mindfulness. Follow these instructions at home: Lifestyle Do not use any products that contain nicotine or tobacco. These products include cigarettes, chewing tobacco, and vaping devices, such as e-cigarettes. If you need help quitting, ask your health care provider. Do not drink alcohol when you are  pregnant. It is also safest not to drink alcohol if you are breastfeeding. After your baby is born, if you drink alcohol: Limit how much you have to 0-1 drink a day. Be aware of how much alcohol is in your drink. In the [place] one drink equals one 12 oz bottle of beer (355 mL), one 5 oz glass of wine (148 mL), or one 1 oz glass of hard liquor (44 mL). Consider joining a support group for new mothers. Ask your health care provider for recommendations. Take good care of yourself. Make sure you: Get as much sleep as possible. Talk with your partner about sharing the responsibility of getting up with your baby if possible and sharing child care responsibilities equally. Make sleep a priority. Eat a healthy diet. This includes plenty of fruits and vegetables, whole grains, and lean proteins. Exercise regularly, as told by your health care provider. Ask your health care provider what exercises are safe for you. Talk with your partner about making sure you both have opportunities to exercise. General instructions Take over-the-counter and prescription medicines only as told by your health care provider. Talk with your partner or family members about your feelings during pregnancy. Share any concerns, needs, or anxieties that you may have. Do not be afraid to ask for help. Find a mental health professional, if needed. Ask for help with tasks or chores when you need it. Ask friends and family members to provide meals, watch your children, or help with cleaning. Keep all follow-up visits. This is important. Contact a health care provider if: You  or people close to you notice that you have symptoms of depression. Your symptoms of depression get worse. You take medicines and have side effects, such as nausea or sleep problems. Get help right away if: You feel like hurting yourself, your baby, or someone else. If you feel like you may hurt yourself or others, or have thoughts about taking your own life, get  help right away. You can go to your nearest emergency department or: Call your local emergency services (911 in the [place] Call a suicide crisis helpline, such as the National Suicide Prevention Lifeline, at 517-558-1371 or 988 in the [place] This is open 24 hours a day in the [place] Text the Crisis Text Line at (570)688-5041 (in the [place] Summary Perinatal depression is when a woman feels excessive sadness, anger, or anxiety during pregnancy or during the first 12 months after she gives birth. If perinatal depression is not managed properly, it can interfere with the woman's ability to take care of the baby. This condition is treated with medicines, talk therapy, stress reduction therapies, or a combination of treatments. Talk with your partner or family members about your feelings. Ask for help when you need it. This information is not intended to replace advice given to you by your health care provider. Make sure you discuss any questions you have with your health care provider. Document Revised: 07/23/2020 Document Reviewed: 06/22/2019 Elsevier Patient Education  2022 Else[employer] Inc.       Perinatal Mood and Anxiety Disorder Perinatal mood and anxiety disorder (PMAD) is a mental health condition that happens when a person feels excessive sadness, anger, or worry and tension (anxiety) during pregnancy or during the first few months after the birth. This condition can last a few months or may continue for years if left untreated. PMAD may cause serious problems for the mother, her baby, or the father if not properly managed. Depression and anxiety can interfere with the ability to take care of the baby. It also may affect work, school, relationships, and other everyday activities. Having the baby blues is considered normal. Mild to moderate levels of sadness, exhaustion, and generally struggling with being a parent are considered the blues. Many parents experience these during the first 1-2 weeks after  giving birth. If these symptoms become worse or last too long, it may be PMAD. What are the causes? The exact cause of this condition is not known. It may result from a combination of hormone changes and biological, social, and psychological factors. What increases the risk? The following factors may make you more likely to develop this condition: Having a personal or family history of depression, anxiety, or mood disorders. Experiencing a stressful life event during pregnancy, such as the death of a loved one. Having additional life stress, such as being a single parent. Having thyroid problems. What are the signs or symptoms? Symptoms of this condition include: Physical symptoms, such as: Panic attacks. These are intense episodes of fear or discomfort that may also cause sweating, nausea, shortness of breath, or fear of dying. They usually last 5-15 minutes but can last longer. Performing repetitive tasks to relieve stress or worry (obsessive compulsive disorder, or OCD). Problems with appetite or sleep. Emotional symptoms, such as: Excessive worry about problems or feeling like something bad will happen (generalized anxiety disorder). Phobias, which are fears of certain objects or situations. Separation anxiety, or fear and stress about leaving certain people or loved ones. Behavioral symptoms, such as: Depression, or lack of motivation and  energy. Intense mood swings involving emotional highs and lows. Feeling out of control or like you are going crazy. Having difficulty bonding with your baby. Some people also have trouble relaxing, problems concentrating, problems sleeping, frequent nightmares, and disturbing thoughts. PMAD can be different for everyone and can affect men as well as women. How is this diagnosed? This condition is diagnosed based on a physical exam and mental evaluation. In some cases, your health care provider may use an anxiety or depression screening tool. This  includes a list of questions that can help a health care provider diagnose PMAD. You may be referred to a mental health expert who specializes in treating PMAD. How is this treated? This condition may be treated with: Talk therapy with a mental health professional. This may be family therapy, marriage therapy, cognitive behavioral therapy, or interpersonal therapy. Medicines. Your health care provider will discuss medicines that are safe to use during pregnancy and breastfeeding. Stress reduction therapies, such as mindfulness, deep breathing, or guided muscle relaxation. Support groups, early childhood education, or other groups to help with being a parent. Follow these instructions at home: Lifestyle Do not use any products that contain nicotine or tobacco. These products include cigarettes, chewing tobacco, and vaping devices, such as e-cigarettes. If you need help quitting, ask your health care provider. Do not drink alcohol when you are pregnant. It is also safest not to drink alcohol if you are breastfeeding. After your baby is born, if you drink alcohol: Limit how much you have to 0-1 drink a day. Be aware of how much alcohol is in your drink. In the [place] one drink equals one 12 oz bottle of beer (355 mL), one 5 oz glass of wine (148 mL), or one 1 oz glass of hard liquor (44 mL). Consider joining a support group for new mothers. Ask your health care provider for recommendations. Take good care of yourself. Make sure you: Get as much rest as possible. Talk with your partner about sharing the responsibility of getting up with your baby if possible. Make sleep a priority. Eat a healthy diet. This includes plenty of fruits and vegetables, whole grains, and lean proteins. Exercise regularly, as told by your health care provider. Ask your health care provider what exercises are safe for you. Talk with your partner about making sure you both have opportunities to exercise. General  instructions Take over-the-counter and prescription medicines only as told by your health care provider. Talk with your partner or family members about your feelings during pregnancy. Share your concerns, needs, or anxieties with each other. Do not be afraid to ask for help. Find a mental health professional, if needed. Ask for help with tasks or chores when you need it. Ask friends and family members to provide meals, watch your children, or help with cleaning. If friends or family are not able to help, consider finding a licensed child care provider or professional house cleaner if needed. Let your partner know what you need. He or she may be struggling too. Keep all follow-up visits. This is important. Contact a health care provider if: You or people close to you notice that you have symptoms of anxiety or depression. Your symptoms of anxiety or depression get worse. You take medicines and have side effects that are uncomfortable or difficult to tolerate. Get help right away if: You feel like hurting yourself, your baby, or someone else. If you feel like you may hurt yourself or others, or have thoughts about taking your  own life, get help right away. Go to your nearest emergency department or: Call your local emergency services (911 in the [place] Call a suicide crisis helpline, such as the National Suicide Prevention Lifeline at 949-446-2461 or 988 in the [place] This is open 24 hours a day in the [place] Text the Crisis Text Line at 984-344-1771 (in the [place] Summary Perinatal mood and anxiety disorder (PMAD) is when a woman or her partner feels excessive sadness, anger, or worry and tension (anxiety) during pregnancy or during the first few months after the birth. PMAD may include depression, intense mood swings, panic attacks, separation anxiety, phobias, or generalized anxiety. PMAD can cause problems for the mother, the baby, or the father if not properly managed. This condition is treated with  medicines, talk therapy, stress reduction therapies, or a combination of treatments. Talk with your partner or family members about your concerns or fears. Ask for help when you need it. This information is not intended to replace advice given to you by your health care provider. Make sure you discuss any questions you have with your health care provider. Document Revised: 07/23/2020 Document Reviewed: 06/22/2019 Elsevier Patient Education  2022 Elsevier Inc.       Pregnancy and Anemia Anemia is a condition in which there is not enough red blood cells or hemoglobin in the blood. Hemoglobin is a substance in red blood cells that carries oxygen. When you do not have enough red blood cells or hemoglobin (are anemic), your body cannot get enough oxygen and your organs may not work properly. Anemia is common during pregnancy because your body needs more blood volume and blood cells to provide nutrition to the unborn baby. What are the causes? The most common cause of anemia during pregnancy is not having enough iron in the body to make red blood cells (iron deficiency anemia). Other causes may include: Folic acid deficiency. Vitamin B12 deficiency. Certain prescription or over-the-counter medicines. Certain medical conditions or infections that destroy red blood cells. A low platelet count and bleeding caused by antibodies that go through the placenta to the baby from the mother's blood. What are the signs or symptoms? Mild anemia may not cause any symptoms. If anemia becomes severe, symptoms may include: Feeling tired or weak. Shortness of breath, especially during activity. Fainting. Pale skin. Headaches. A fast or irregular heartbeat. Dizziness. How is this diagnosed? This condition may be diagnosed based on your medical history and a physical exam. You may also have blood tests. How is this treated? Treatment for anemia during pregnancy depends on the cause of the anemia. Treatment  may include: Making changes to your diet. Taking iron, vitamin B12, or folic acid supplements. Having a blood transfusion. This may be needed if the anemia is severe. Follow these instructions at home: Eating and drinking Follow recommendations from your health care provider about changing your diet. Eat a diet rich in iron. This would include foods such as: Liver. Beef. Eggs. Whole grains. Spinach. Dried fruit. Increase your vitamin C intake. This will help the stomach absorb more iron. Some foods that are high in vitamin C include: Oranges. Peppers. Tomatoes. Mangoes. Eat green leafy vegetables. These are a good source of folic acid. General instructions Take iron supplements and vitamins as told by your health care provider. Keep all follow-up visits. This is important. Contact a health care provider if: You have headaches that happen often or do not go away. You bruise easily. You have a fever. You have nausea and  vomiting for more than 24 hours. You are unable to take supplements prescribed to treat your anemia. Get help right away if: You develop signs or symptoms of severe anemia. You have bleeding from your vagina. You develop a rash. You have bloody or tarry stools. You are very dizzy or you faint. Summary Anemia is a condition in which there is not enough red blood cells or hemoglobin in the blood. The most common cause of anemia during pregnancy is not having enough iron in the body to make red blood cells (iron deficiency anemia). Mild anemia may not cause any symptoms. If it becomes severe, symptoms may include feeling tired and weak. Take iron supplements and vitamins as told by your health care provider. Keep all follow-up visits. This is important. This information is not intended to replace advice given to you by your health care provider. Make sure you discuss any questions you have with your health care provider. Document Revised: 05/01/2019 Document  Reviewed: 05/01/2019 Elsevier Patient Education  2022 Else[employer] Inc.       Group B Streptococcus Test During Pregnancy Why am I having this test? Routine testing, also called screening, for group B streptococcus (GBS) is recommended for all pregnant women between the 36th and 37th week of pregnancy. GBS is a type of bacteria that can be passed from mother to baby during childbirth. Screening will help guide whether or not you will need treatment during labor and delivery to prevent complications such as: An infection in your uterus during labor. An infection in your uterus after delivery. A serious infection in your baby after delivery, such as pneumonia, meningitis, or sepsis. GBS screening is not often done before 36 weeks of pregnancy unless you go into labor prematurely. What happens if I have group B streptococcus? If testing shows that you have GBS, your health care provider will recommend treatment with IV antibiotics during labor and delivery. This treatment significantly decreases the risk of complications for you and your baby. If you have a planned C-section and you have GBS, you may not need to be treated with antibiotics because GBS is usually passed to babies after labor starts and your water breaks. If you are in labor or your water breaks before your C-section, it is possible for GBS to get into your uterus and be passed to your baby, so you might need treatment. Is there a chance I may not need to be tested? You may not need to be tested for GBS if: You have a urine test that shows GBS before 36 to 37 weeks. You had a baby with GBS infection after a previous delivery. In these cases, you will automatically be treated for GBS during labor and delivery. What is being tested? This test is done to check if you have group B streptococcus in your vagina or rectum. What kind of sample is taken? To collect samples for this test, your health care provider will swab your vagina and  rectum with a cotton swab. The sample is then sent to the lab to see if GBS is present. What happens during the test?  You will remove your clothing from the waist down. You will lie down on an exam table in the same position as you would for a pelvic exam. Your health care provider will swab your vagina and rectum to collect samples for a culture test. You will be able to go home after the test and do all your usual activities. How are the results reported?  The test results are reported as positive or negative. What do the results mean? A positive test means you are at risk for passing GBS to your baby during labor and delivery. Your health care provider will recommend that you are treated with an IV antibiotic during labor and delivery. A negative test means you are at very low risk of passing GBS to your baby. There is still a low risk of passing GBS to your baby because sometimes test results may report that you do not have a condition when you do (false-negative result) or there is a chance that you may become infected with GBS after the test is done. You most likely will not need to be treated with an antibiotic during labor and delivery. Talk with your health care provider about what your results mean. Questions to ask your health care provider Ask your health care provider, or the department that is doing the test: When will my results be ready? How will I get my results? What are my treatment options? Summary Routine testing (screening) for group B streptococcus (GBS) is recommended for all pregnant women between the 36th and 37th week of pregnancy. GBS is a type of bacteria that can be passed from mother to baby during childbirth. If testing shows that you have GBS, your health care provider will recommend that you are treated with IV antibiotics during labor and delivery. This treatment almost always prevents infection in newborns. This information is not intended to replace advice  given to you by your health care provider. Make sure you discuss any questions you have with your health care provider. Document Revised: 10/30/2019 Document Reviewed: 01/25/2018 Elsevier Patient Education  2022 [employer].

## 2020-11-19 NOTE — Progress Notes (Signed)
Pt presents for ROB without complaints today. Flu and Tdap offered; pt declined both.  PHQ9= 5  GAD7= 3

## 2020-11-19 NOTE — Progress Notes (Addendum)
Subjective:  Amy Elliott is a 20 y.o. G1P0000 at [redacted]w[redacted]d being seen today for ongoing prenatal care.  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  Patient reports no complaints.  Contractions: Not present (Simultaneous filing. User may not have seen previous data.). Vag. Bleeding: None (Simultaneous filing. User may not have seen previous data.).  Movement: Present (Simultaneous filing. User may not have seen previous data.). Denies leaking of fluid.   The following portions of the patient's history were reviewed and updated as appropriate: allergies, current medications, past family history, past medical history, past social history, past surgical history and problem list. Problem list updated.  Objective:   Vitals:   11/19/20 1044  BP: 131/73  Pulse: 77  Weight: 267 lb 6.4 oz (121.3 kg)    Fetal Status: Fetal Heart Rate (bpm): 135   Movement: Present (Simultaneous filing. User may not have seen previous data.)     General:  Alert, oriented and cooperative. Patient is in no acute distress.  Skin: Skin is warm and dry. No rash noted.   Cardiovascular: Normal heart rate noted  Respiratory: Normal respiratory effort, no problems with respiration noted  Abdomen: Soft, gravid, appropriate for gestational age. Pain/Pressure: Absent (Simultaneous filing. User may not have seen previous data.)     Pelvic: Vag. Bleeding: None (Simultaneous filing. User may not have seen previous data.)     Cervical exam deferred        Extremities: Normal range of motion.  Edema:  None  Mental Status: Normal mood and affect. Normal behavior. Normal judgment and thought content.   Urinalysis:      Assessment and Plan:  Pregnancy: G1P0000 at [redacted]w[redacted]d  1. Encounter for supervision of normal first pregnancy in third trimester - interested in WB, class taken, certificate presented today  2. Obesity in pregnancy, antepartum  3. Anemia of mother in pregnancy, antepartum CBC Latest Ref Rng & Units 10/08/2020 06/19/2020 02/25/2020  WBC 3.4 - 10.8 x10E3/uL 10.1 6.3 6.0  Hemoglobin 11.1 - 15.9 g/dL 3.1(D) 10.6(L) 12.7  Hematocrit 34.0 - 46.6 % 28.7(L) 31.3(L) 39.3  Platelets 150 - 450 x10E3/uL 264 277 308  -oral iron QOD  4. Prediabetes -GTT normal  5. Moderate episode of recurrent major depressive disorder (HCC) -pt states she is feeling well, declines BH referral at this time, reports mood is dependent on sleep - if she gets normal sleep she feels well, if she does not get good sleep she does not feel well. Patient advised can contact us at any time for [facility] referral.  PHQ9 SCORE ONLY 11/19/2020 06/19/2020 06/06/2020  PHQ-9 Total Score 5 3 4    6. Generalized anxiety disorder  GAD 7 : Generalized Anxiety Score 11/19/2020 06/19/2020 06/06/2020 08/15/2017  Nervous, Anxious, on Edge 0 1 1 2   Control/stop worrying 0 1 0 1  Worry too much - different things 0 1 0 1  Trouble relaxing 1 0 1 2  Restless 1 0 1 1  Easily annoyed or irritable 1 1 2 1   Afraid - awful might happen 0 0 1 1  Total GAD 7 Score 3 4 6 9   Anxiety Difficulty Not difficult at all - - -  7. [redacted] weeks gestation of pregnancy  Preterm labor symptoms and general obstetric precautions including but not limited to vaginal bleeding, contractions, leaking of fluid and fetal movement were reviewed in detail with the patient. I discussed the assessment and treatment plan with the patient. The patient was provided an opportunity to ask questions and all were answered. The patient agreed with the plan and demonstrated an  understanding of the instructions. The patient was advised to call back or seek an in-person office evaluation/go to MAU at [facility] for any urgent or concerning symptoms. Please refer to After Visit Summary for other counseling recommendations.  Return in about 2 weeks (around 12/03/2020) for in-person LOB/MIDWIFE ONLY/GBS/cultures.   Kitt Ledet, Gerrie Nordmann, NP

## 2020-12-01 ENCOUNTER — Ambulatory Visit (INDEPENDENT_AMBULATORY_CARE_PROVIDER_SITE_OTHER): Payer: Medicaid Other | Admitting: Obstetrics and Gynecology

## 2020-12-01 ENCOUNTER — Other Ambulatory Visit: Payer: Self-pay

## 2020-12-01 ENCOUNTER — Other Ambulatory Visit (HOSPITAL_COMMUNITY)
Admission: RE | Admit: 2020-12-01 | Discharge: 2020-12-01 | Disposition: A | Discharge status: Home / Self Care | Payer: Medicaid Other | Source: Ambulatory Visit | Attending: Obstetrics and Gynecology | Admitting: Obstetrics and Gynecology

## 2020-12-01 ENCOUNTER — Encounter: Payer: Self-pay | Admitting: Obstetrics and Gynecology

## 2020-12-01 VITALS — BP 138/83 | HR 80 | Wt 274.2 lb

## 2020-12-01 DIAGNOSIS — Z3403 Encounter for supervision of normal first pregnancy, third trimester: Secondary | ICD-10-CM | POA: Insufficient documentation

## 2020-12-01 DIAGNOSIS — Z3A36 36 weeks gestation of pregnancy: Secondary | ICD-10-CM | POA: Insufficient documentation

## 2020-12-01 NOTE — Progress Notes (Signed)
   PRENATAL VISIT NOTE  Subjective:  Amy Elliott is a 20 y.o. G1P0000 at [redacted]w[redacted]d being seen today for ongoing prenatal care.  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  Patient reports no complaints.  Contractions: Not present. Vag. Bleeding: None.  Movement: Present. Denies leaking of fluid.   The following portions of the patient's history were reviewed and updated as appropriate: allergies, current medications, past family history, past medical history, past social history, past surgical history and problem list.   Objective:   Vitals:   12/01/20 1457  BP: 138/83  Pulse: 80  Weight: 274 lb 3.2 oz (124.4 kg)    Fetal Status: Fetal Heart Rate (bpm): 133 Fundal Height: 36 cm Movement: Present     General:  Alert, oriented and cooperative. Patient is in no acute distress.  Skin: Skin is warm and dry. No rash noted.   Cardiovascular: Normal heart rate noted  Respiratory: Normal respiratory effort, no problems with respiration noted  Abdomen: Soft, gravid, appropriate for gestational age.  Pain/Pressure: Absent     Pelvic: Cervical exam performed in the presence of a chaperone Dilation: Closed Effacement (%): Thick Station: Ballotable  Extremities: Normal range of motion.  Edema: Trace  Mental Status: Normal mood and affect. Normal behavior. Normal judgment and thought content.   Assessment and Plan:  Pregnancy: G1P0000 at [redacted]w[redacted]d 1. Encounter for supervision of normal first pregnancy in third trimester Patient is doing well without complaints Cultures  next visit - Culture, beta strep (group b only) - Cervicovaginal ancillary only  2. [redacted] weeks gestation of pregnancy - Culture, beta strep (group b only) - Cervicovaginal ancillary only  Preterm labor symptoms and general obstetric precautions including but not limited to vaginal bleeding, contractions, leaking of fluid and fetal movement were reviewed in detail with the patient. Please refer to After Visit Summary for other counseling recommendations.   Return in about 1 week (around 12/08/2020) for in person, ROB, Low risk with midwife for waterbirth consent.  No future appointments.  Catalina Antigua, MD

## 2020-12-02 LAB — CERVICOVAGINAL ANCILLARY ONLY
Chlamydia: NEGATIVE
Comment: NEGATIVE
Comment: NEGATIVE
Comment: NORMAL
Neisseria Gonorrhea: NEGATIVE
Trichomonas: NEGATIVE

## 2020-12-03 ENCOUNTER — Encounter: Payer: Medicaid Other | Admitting: Obstetrics and Gynecology

## 2020-12-04 LAB — CULTURE, BETA STREP (GROUP B ONLY): Strep Gp B Culture: POSITIVE — AB

## 2020-12-06 IMAGING — DX DG FOOT COMPLETE 3+V*R*
3 series · 3 of 3 positions shown · non-contrast
Comparison: None.

CLINICAL DATA: Pt reports burning pain and a "knot" to the side of
her foot. Denies any rashes or redness, no trauma or known injury.

EXAM:
RIGHT FOOT COMPLETE - 3+ VIEW

[foot ap]
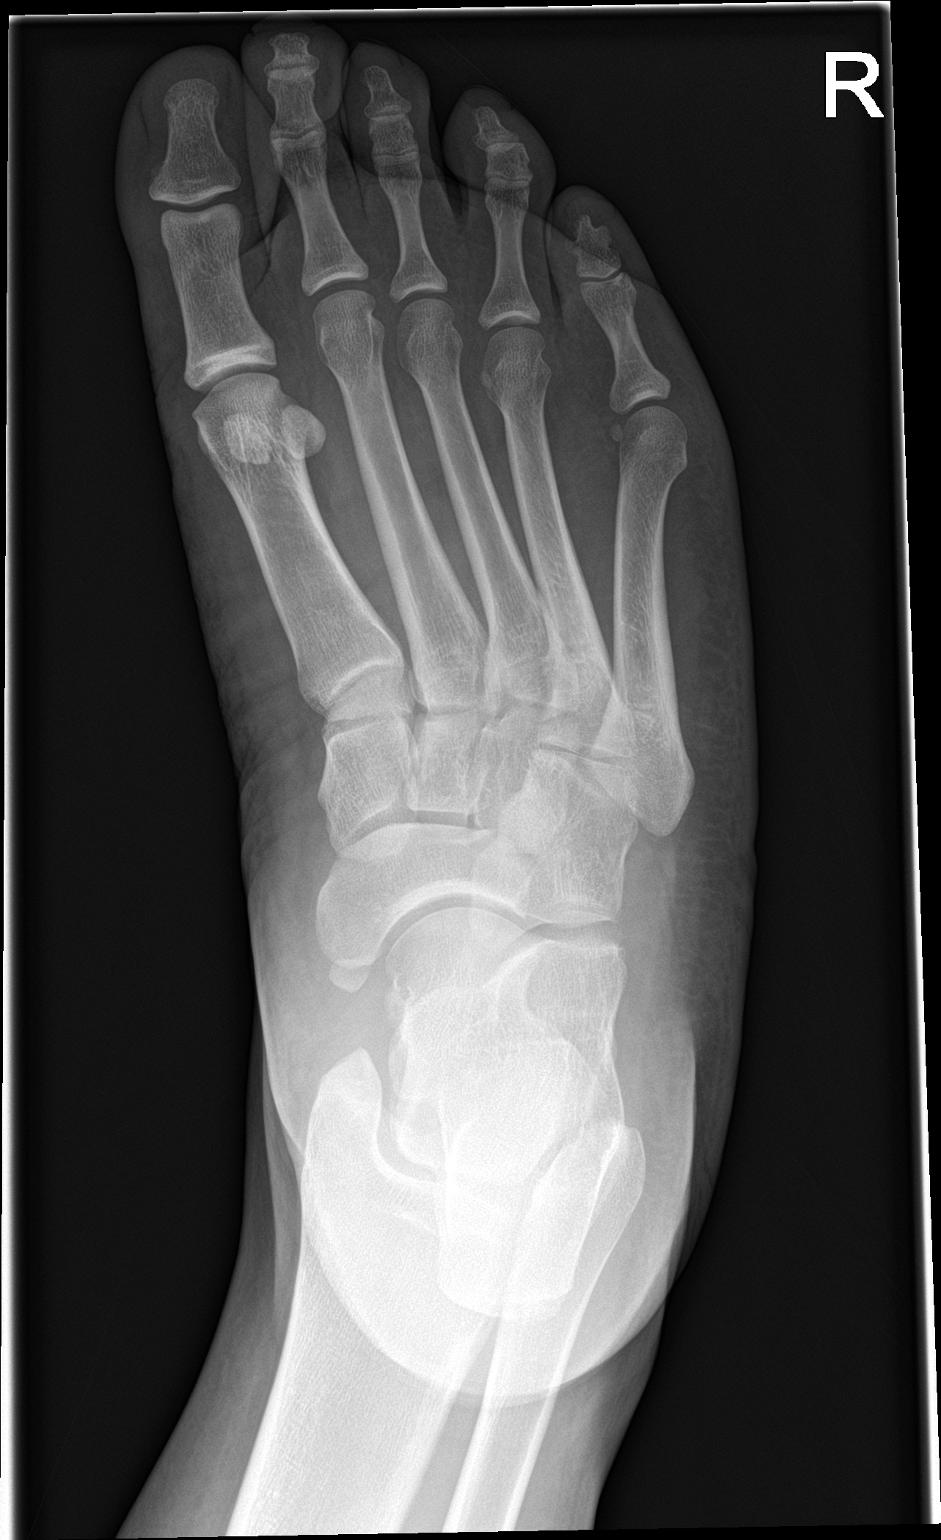

[foot obl]
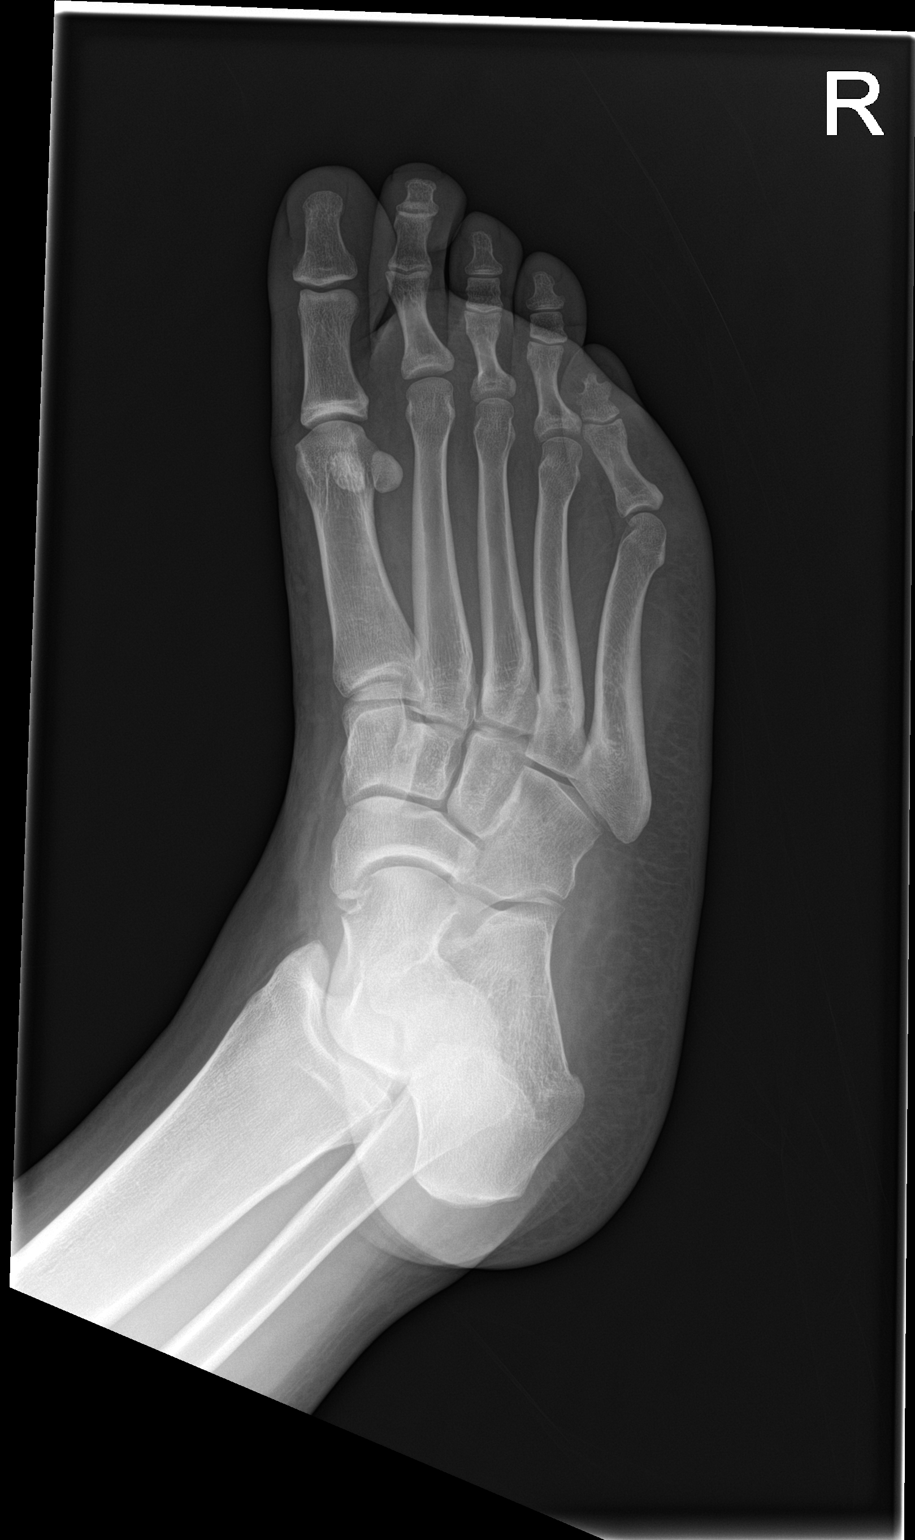

[foot lat]
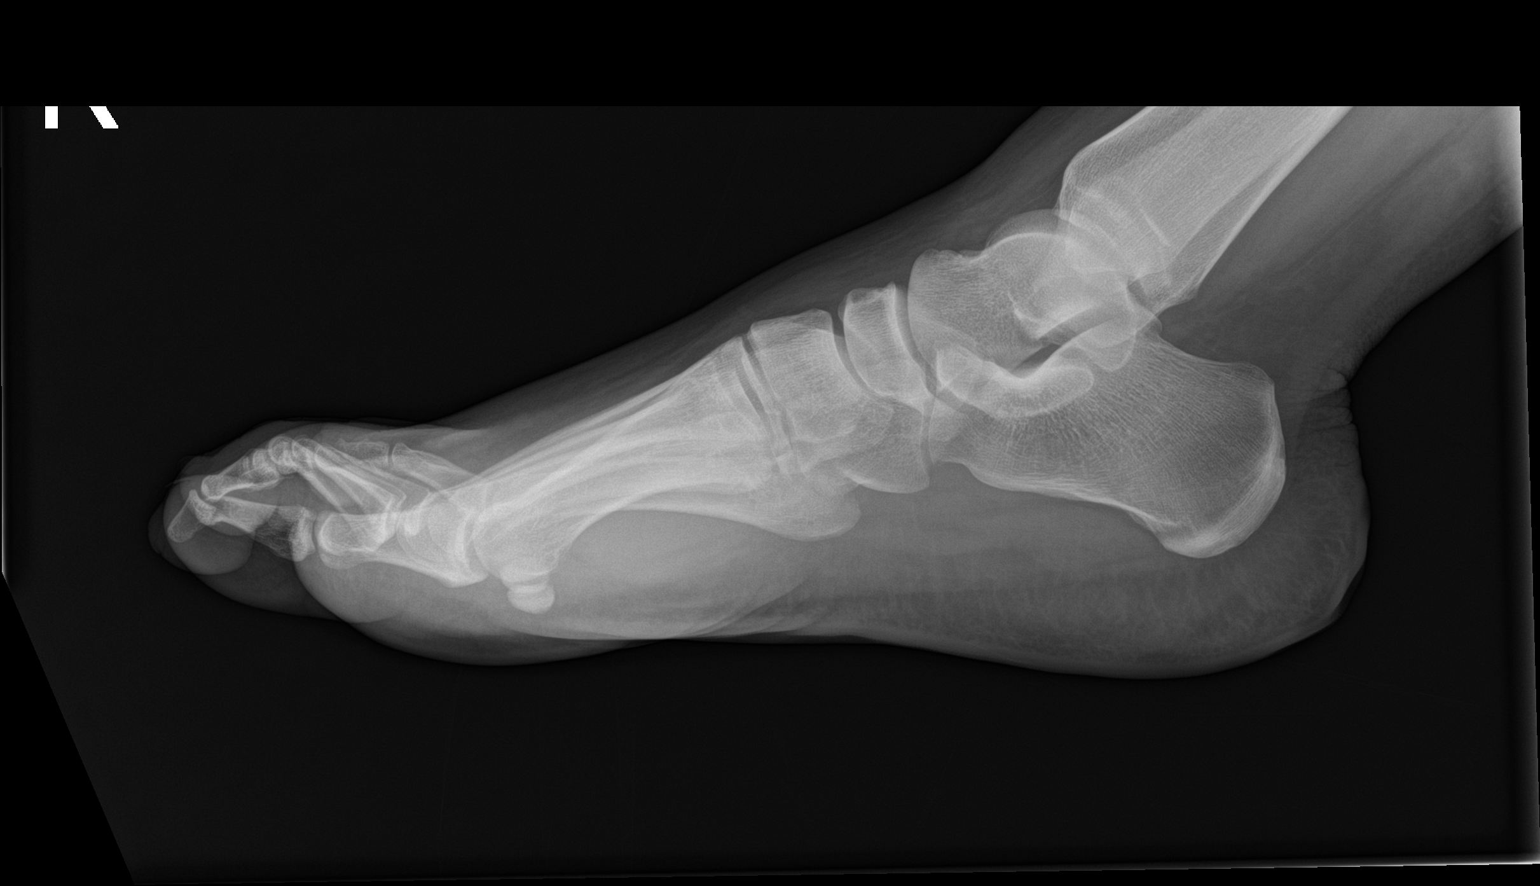

[3 of 3 positions shown; findings below may reference images not displayed]

FINDINGS: There is no evidence of fracture or dislocation. There is no
evidence of arthropathy or other focal bone abnormality. Soft
tissues are unremarkable.
IMPRESSION: Negative right foot radiographs.

## 2020-12-08 ENCOUNTER — Encounter: Payer: Self-pay | Admitting: Obstetrics and Gynecology

## 2020-12-08 DIAGNOSIS — O99820 Streptococcus B carrier state complicating pregnancy: Secondary | ICD-10-CM | POA: Insufficient documentation

## 2020-12-09 ENCOUNTER — Ambulatory Visit (INDEPENDENT_AMBULATORY_CARE_PROVIDER_SITE_OTHER): Payer: Medicaid Other | Admitting: Family Medicine

## 2020-12-09 ENCOUNTER — Other Ambulatory Visit: Payer: Self-pay

## 2020-12-09 VITALS — BP 121/71 | HR 80 | Wt 274.0 lb

## 2020-12-09 DIAGNOSIS — R030 Elevated blood-pressure reading, without diagnosis of hypertension: Secondary | ICD-10-CM

## 2020-12-09 DIAGNOSIS — O99820 Streptococcus B carrier state complicating pregnancy: Secondary | ICD-10-CM

## 2020-12-09 DIAGNOSIS — Z3403 Encounter for supervision of normal first pregnancy, third trimester: Secondary | ICD-10-CM

## 2020-12-09 DIAGNOSIS — Z3A37 37 weeks gestation of pregnancy: Secondary | ICD-10-CM

## 2020-12-09 NOTE — Progress Notes (Signed)
   Subjective:  Amy Elliott is a 20 y.o. G1P0000 at [redacted]w[redacted]d being seen today for ongoing prenatal care.  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   Home BP: 120s-130s/80s. Last check this weekend. No hx of prepregnancy BP issues.  Patient reports no complaints.  Contractions: Not present. Vag. Bleeding: None.  Movement: Present. Denies leaking of fluid.   The following portions of the patient's history were reviewed and updated as appropriate: allergies, current medications, past family history, past medical history, past social history, past surgical history and problem list. Problem list updated.  Objective:   Vitals:   12/09/20 1551 12/09/20 1552  BP: (!) 141/76 121/71  Pulse: 80 80  Weight: 274 lb (124.3 kg)    BP recheck: 121/71. Initial BP right after patient walked in  Fetal Status: Fetal Heart Rate (bpm): 148   Movement: Present     General:  Alert, oriented and cooperative. Patient is in no acute distress.  Skin: Skin is warm and dry. No rash noted.   Cardiovascular: Normal heart rate noted  Respiratory: Normal respiratory effort, no problems with respiration noted  Abdomen: Soft, gravid, appropriate for gestational age. Pain/Pressure: Absent     Pelvic: Vag. Bleeding: None     Cervical exam performed      closed/thick/high  Extremities: Normal range of motion.  Edema: None  Mental Status: Normal mood and affect. Normal behavior. Normal  judgment and thought content.    Assessment and Plan:  Pregnancy: G1P0000 at [redacted]w[redacted]d  1. Encounter for supervision of normal first pregnancy in third trimester 2. [redacted] weeks gestation of pregnancy Patient without any acute symptoms. Elevated BP read today on initial read. Second read normal. (See plan below) - follow up in 1 week  2. GBS (group B Streptococcus carrier), +RV culture, currently pregnant Plan for PCN during labor   4. Elevated BP reading Looked through all prior BP reads. None that were >140s or >90s. Also currently asymptomatic and read immediately after elevated read was normal. Will get labs as baseline.  Discussed in detail regarding checking BP at home everyday and when to go to hospital (including if has any BP >140s or >90s). Discussed  If that happens she meets criteria for gHTN and since at 37 weeks can be induced.  - discussed that if ends up having to be induced unfortunately she risks out of qualifying for a waterbirth - Urine p:c, CBC, and CMP sent  Term labor symptoms and general obstetric precautions including but not limited to vaginal bleeding, contractions, leaking of fluid and fetal movement were reviewed in detail with the patient. Please refer to After Visit Summary for other counseling recommendations.  Return in about 1 week (around 12/16/2020) for ROB, midwife preferred.   Warner Mccreedy, MD, MPH OB Fellow, Faculty [facility]

## 2020-12-10 ENCOUNTER — Ambulatory Visit (INDEPENDENT_AMBULATORY_CARE_PROVIDER_SITE_OTHER): Payer: Medicaid Other

## 2020-12-10 VITALS — BP 138/79 | HR 81 | Ht 66.0 in | Wt 276.0 lb

## 2020-12-10 DIAGNOSIS — O99820 Streptococcus B carrier state complicating pregnancy: Secondary | ICD-10-CM

## 2020-12-10 LAB — PROTEIN / CREATININE RATIO, URINE
Creatinine, Urine: 186.8 mg/dL
Protein, Ur: 20.9 mg/dL
Protein/Creat Ratio: 112 mg/g creat (ref 0–200)

## 2020-12-10 LAB — COMPREHENSIVE METABOLIC PANEL
ALT: 7 IU/L (ref 0–32)
AST: 13 IU/L (ref 0–40)
Albumin/Globulin Ratio: 1.2 (ref 1.2–2.2)
Albumin: 3.4 g/dL — ABNORMAL LOW (ref 3.9–5.0)
Alkaline Phosphatase: 138 IU/L — ABNORMAL HIGH (ref 44–121)
BUN/Creatinine Ratio: 5 — ABNORMAL LOW (ref 9–23)
BUN: 4 mg/dL — ABNORMAL LOW (ref 6–20)
Bilirubin Total: 0.3 mg/dL (ref 0.0–1.2)
CO2: 22 mmol/L (ref 20–29)
Calcium: 9.5 mg/dL (ref 8.7–10.2)
Chloride: 105 mmol/L (ref 96–106)
Creatinine, Ser: 0.75 mg/dL (ref 0.57–1.00)
Globulin, Total: 2.9 g/dL (ref 1.5–4.5)
Glucose: 87 mg/dL (ref 70–99)
Potassium: 4.4 mmol/L (ref 3.5–5.2)
Sodium: 139 mmol/L (ref 134–144)
Total Protein: 6.3 g/dL (ref 6.0–8.5)
eGFR: 116 mL/min/{1.73_m2} (ref >59)

## 2020-12-10 LAB — CBC
Hematocrit: 29.6 % — ABNORMAL LOW (ref 34.0–46.6)
Hemoglobin: 10.3 g/dL — ABNORMAL LOW (ref 11.1–15.9)
MCH: 29 pg (ref 26.6–33.0)
MCHC: 34.8 g/dL (ref 31.5–35.7)
MCV: 83 fL (ref 79–97)
Platelets: 276 10*3/uL (ref 150–450)
RBC: 3.55 x10E6/uL — ABNORMAL LOW (ref 3.77–5.28)
RDW: 12.3 % (ref 11.7–15.4)
WBC: 10.6 10*3/uL (ref 3.4–10.8)

## 2020-12-10 NOTE — Progress Notes (Signed)
Subjective:  Amy Elliott is a 20 y.o. female here for BP check.   Hypertension ROS: no TIA's, no chest pain on exertion, no dyspnea on exertion, and no swelling of ankles.    Objective:  LMP 03/23/2020   Appearance alert, well appearing, and in no distress. General exam BP noted to be well controlled today in office.    Assessment:   Blood Pressure stable.   Plan:  Current treatment plan is effective, no change in therapy.

## 2020-12-16 ENCOUNTER — Ambulatory Visit (INDEPENDENT_AMBULATORY_CARE_PROVIDER_SITE_OTHER): Payer: Medicaid Other | Admitting: Obstetrics

## 2020-12-16 ENCOUNTER — Other Ambulatory Visit: Payer: Self-pay

## 2020-12-16 ENCOUNTER — Encounter: Payer: Self-pay | Admitting: Obstetrics

## 2020-12-16 VITALS — BP 132/70 | HR 71 | Wt 277.6 lb

## 2020-12-16 DIAGNOSIS — Z3403 Encounter for supervision of normal first pregnancy, third trimester: Secondary | ICD-10-CM

## 2020-12-16 NOTE — Progress Notes (Signed)
Subjective:  Amy Elliott is a 20 y.o. G1P0000 at [redacted]w[redacted]d being seen today for ongoing prenatal care.  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  Patient reports no complaints.  Contractions: Not present. Vag. Bleeding: None.  Movement: Present. Denies leaking of fluid.   The following portions of the patient's history were reviewed and updated as appropriate: allergies, current medications, past family history, past medical history, past social history, past surgical history and problem list. Problem list updated.  Objective:   Vitals:   12/16/20 1400  BP: 132/70  Pulse: 71  Weight: 277 lb 9.6 oz (125.9 kg)    Fetal Status:     Movement: Present     General:  Alert, oriented and cooperative. Patient is in no acute distress.  Skin: Skin is warm and dry. No rash noted.   Cardiovascular: Normal heart rate noted  Respiratory: Normal respiratory effort, no problems with respiration noted  Abdomen: Soft, gravid, appropriate for gestational age. Pain/Pressure: Present     Pelvic:  Cervical exam deferred        Extremities: Normal range of motion.  Edema: None  Mental Status: Normal mood and affect. Normal behavior. Normal judgment and thought content.   Urinalysis:      Assessment and Plan:  Pregnancy: G1P0000 at [redacted]w[redacted]d  1. Encounter for supervision of normal first pregnancy in third trimester   Term labor symptoms and general obstetric precautions  including but not limited to vaginal bleeding, contractions, leaking of fluid and fetal movement were reviewed in detail with the patient. Please refer to After Visit Summary for other counseling recommendations.   Return in about 1 week (around 12/23/2020) for ROB.   Brock Bad, MD  12/16/20

## 2020-12-22 ENCOUNTER — Ambulatory Visit (INDEPENDENT_AMBULATORY_CARE_PROVIDER_SITE_OTHER): Payer: Medicaid Other | Admitting: Advanced Practice Midwife

## 2020-12-22 ENCOUNTER — Other Ambulatory Visit: Payer: Self-pay

## 2020-12-22 VITALS — BP 127/80 | HR 70 | Wt 280.0 lb

## 2020-12-22 DIAGNOSIS — Z3A39 39 weeks gestation of pregnancy: Secondary | ICD-10-CM

## 2020-12-22 DIAGNOSIS — O99820 Streptococcus B carrier state complicating pregnancy: Secondary | ICD-10-CM

## 2020-12-22 DIAGNOSIS — R030 Elevated blood-pressure reading, without diagnosis of hypertension: Secondary | ICD-10-CM

## 2020-12-22 DIAGNOSIS — Z3403 Encounter for supervision of normal first pregnancy, third trimester: Secondary | ICD-10-CM

## 2020-12-22 NOTE — Patient Instructions (Signed)
Things to Try After 37 weeks to Encourage Labor/Get Ready for Labor:    Try the [employer] Circuit at www.milescircuit.com daily to improve baby's position and encourage the onset of labor.  Walk a little and rest a little every day.  Change positions often.  Cervical Ripening: May try one or both Red Raspberry Leaf capsules or tea:  two 300mg or 400mg tablets with each meal, 2-3 times a day, or 1-3 cups of tea daily  Potential Side Effects Of Raspberry Leaf:  Most women do not experience any side effects from drinking raspberry leaf tea. However, nausea and loose stools are possible   Evening Primrose Oil capsules: take 1 capsule by mouth and place one capsule in the vagina every night.    Some of the potential side effects:  Upset stomach  Loose stools or diarrhea  Headaches  Nausea  Sex can also help the cervix ripen and encourage labor onset.    Labor Precautions Reasons to come to MAU at [facility]:  1.  Contractions are  5 minutes apart or less, each last 1 minute, these have been going on for 1-2 hours, and you cannot walk or talk during them 2.  You have a large gush of fluid, or a trickle of fluid that will not stop and you have to wear a pad 3.  You have bleeding that is bright red, heavier than spotting--like menstrual bleeding (spotting can be normal in early labor or after a check of your cervix) 4.  You do not feel the baby moving like he/she normally does  

## 2020-12-22 NOTE — Progress Notes (Signed)
   PRENATAL VISIT NOTE  Subjective:  Amy Elliott is a 20 y.o. G1P0000 at [redacted]w[redacted]d being seen today for ongoing prenatal care.  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  Patient reports occasional contractions and pelvic pressure .  Contractions: Irritability. Vag. Bleeding: None.  Movement: Present. Denies leaking of fluid.   The following portions of the patient's history were reviewed and updated as appropriate: allergies, current medications, past family history, past medical history, past social history, past surgical history and problem list.   Objective:   Vitals:   12/22/20 0931  BP: 127/80  Pulse: 70  Weight: 280 lb (127 kg)    Fetal Status: Fetal Heart Rate (bpm): 141 Fundal Height: 38 cm Movement: Present  Presentation: Vertex  General:  Alert, oriented and cooperative. Patient is in no acute distress.  Skin: Skin is warm and dry. No rash noted.   Cardiovascular: Normal heart rate noted  Respiratory: Normal respiratory effort, no problems with respiration noted  Abdomen: Soft, gravid, appropriate for gestational age.  Pain/Pressure: Present     Pelvic: Cervical exam performed in the presence of a chaperone Dilation: 1 Effacement (%): 50 Station: -2  Extremities: Normal range of motion.  Edema: None  Mental Status: Normal mood and affect. Normal behavior. Normal judgment and thought content.   Assessment and Plan:  Pregnancy: G1P0000 at  [redacted]w[redacted]d 1. Encounter for supervision of normal first pregnancy in third trimester --Anticipatory guidance about next visits/weeks of pregnancy given. --Cervix 1/50/-2 today, membranes swept at pt request --Labor readiness reviewed, including the [employer] --No barriers to waterbirth at this time, pt aware that waterbirth is for low risk patients and that reasons can arise late in pregnancy or in labor that would prevent immersion in the tub for delivery but that hydrotherapy with the shower and support for physiologic childbirth may still be available.  --next appt in 1 week, no midwife appts available, pt to direct waterbirth questions to CNM via MyChart  2. GBS (group B Streptococcus carrier), +RV culture, currently pregnant   3. Elevated blood pressure reading --Isolated BP with normal retake at 1 prenatal appt, no additional elevated BP --No s/sx of PEC today  4. [redacted] weeks gestation of pregnancy   Term labor symptoms and general obstetric precautions including but not limited to vaginal bleeding, contractions, leaking of fluid and fetal movement were reviewed in detail with the patient. Please refer to After Visit Summary for other counseling recommendations.   Return in about 1 week (around 12/29/2020).  Future Appointments  Date Time Provider Department Center  12/30/2020 10:35 AM Allayne Stack, DO [facility] None    Sharen Counter, [place]

## 2020-12-30 ENCOUNTER — Encounter (HOSPITAL_COMMUNITY): Payer: Self-pay | Admitting: Obstetrics and Gynecology

## 2020-12-30 ENCOUNTER — Inpatient Hospital Stay (HOSPITAL_COMMUNITY)
Admission: AD | Admit: 2020-12-30 | Discharge: 2021-01-04 | DRG: 787 | Disposition: A | Discharge status: Home / Self Care | Payer: Medicaid Other | Attending: Family Medicine | Admitting: Family Medicine

## 2020-12-30 ENCOUNTER — Other Ambulatory Visit: Payer: Self-pay

## 2020-12-30 ENCOUNTER — Ambulatory Visit (INDEPENDENT_AMBULATORY_CARE_PROVIDER_SITE_OTHER): Payer: Medicaid Other | Admitting: Family Medicine

## 2020-12-30 ENCOUNTER — Encounter: Payer: Self-pay | Admitting: Family Medicine

## 2020-12-30 VITALS — BP 138/83 | HR 76 | Wt 286.9 lb

## 2020-12-30 DIAGNOSIS — O99820 Streptococcus B carrier state complicating pregnancy: Secondary | ICD-10-CM | POA: Diagnosis not present

## 2020-12-30 DIAGNOSIS — O480 Post-term pregnancy: Principal | ICD-10-CM | POA: Diagnosis present

## 2020-12-30 DIAGNOSIS — Z3A40 40 weeks gestation of pregnancy: Secondary | ICD-10-CM

## 2020-12-30 DIAGNOSIS — O99214 Obesity complicating childbirth: Secondary | ICD-10-CM | POA: Diagnosis not present

## 2020-12-30 DIAGNOSIS — O99210 Obesity complicating pregnancy, unspecified trimester: Secondary | ICD-10-CM | POA: Diagnosis present

## 2020-12-30 DIAGNOSIS — O9081 Anemia of the puerperium: Secondary | ICD-10-CM | POA: Diagnosis not present

## 2020-12-30 DIAGNOSIS — F411 Generalized anxiety disorder: Secondary | ICD-10-CM | POA: Diagnosis present

## 2020-12-30 DIAGNOSIS — D62 Acute posthemorrhagic anemia: Secondary | ICD-10-CM | POA: Diagnosis not present

## 2020-12-30 DIAGNOSIS — Z7982 Long term (current) use of aspirin: Secondary | ICD-10-CM

## 2020-12-30 DIAGNOSIS — O99824 Streptococcus B carrier state complicating childbirth: Secondary | ICD-10-CM | POA: Diagnosis not present

## 2020-12-30 DIAGNOSIS — Z20822 Contact with and (suspected) exposure to covid-19: Secondary | ICD-10-CM | POA: Diagnosis present

## 2020-12-30 DIAGNOSIS — O134 Gestational [pregnancy-induced] hypertension without significant proteinuria, complicating childbirth: Secondary | ICD-10-CM | POA: Diagnosis present

## 2020-12-30 DIAGNOSIS — O139 Gestational [pregnancy-induced] hypertension without significant proteinuria, unspecified trimester: Secondary | ICD-10-CM

## 2020-12-30 DIAGNOSIS — O99019 Anemia complicating pregnancy, unspecified trimester: Secondary | ICD-10-CM

## 2020-12-30 DIAGNOSIS — Z3403 Encounter for supervision of normal first pregnancy, third trimester: Secondary | ICD-10-CM

## 2020-12-30 LAB — COMPREHENSIVE METABOLIC PANEL
ALT: 12 U/L (ref 0–44)
AST: 19 U/L (ref 15–41)
Albumin: 2.5 g/dL — ABNORMAL LOW (ref 3.5–5.0)
Alkaline Phosphatase: 141 U/L — ABNORMAL HIGH (ref 38–126)
Anion gap: 5 (ref 5–15)
BUN: 7 mg/dL (ref 6–20)
CO2: 21 mmol/L — ABNORMAL LOW (ref 22–32)
Calcium: 8.6 mg/dL — ABNORMAL LOW (ref 8.9–10.3)
Chloride: 109 mmol/L (ref 98–111)
Creatinine, Ser: 0.77 mg/dL (ref 0.44–1.00)
GFR, Estimated: >60 mL/min (ref >60)
Glucose, Bld: 79 mg/dL (ref 70–99)
Potassium: 3.8 mmol/L (ref 3.5–5.1)
Sodium: 135 mmol/L (ref 135–145)
Total Bilirubin: 0.4 mg/dL (ref 0.3–1.2)
Total Protein: 6 g/dL — ABNORMAL LOW (ref 6.5–8.1)

## 2020-12-30 LAB — PROTEIN / CREATININE RATIO, URINE
Creatinine, Urine: 205.69 mg/dL
Protein Creatinine Ratio: 0.1 mg/mg{Cre} (ref 0.00–0.15)
Total Protein, Urine: 21 mg/dL

## 2020-12-30 LAB — CBC
HCT: 30.8 % — ABNORMAL LOW (ref 36.0–46.0)
Hemoglobin: 10.4 g/dL — ABNORMAL LOW (ref 12.0–15.0)
MCH: 28.7 pg (ref 26.0–34.0)
MCHC: 33.8 g/dL (ref 30.0–36.0)
MCV: 84.8 fL (ref 80.0–100.0)
Platelets: 270 10*3/uL (ref 150–400)
RBC: 3.63 MIL/uL — ABNORMAL LOW (ref 3.87–5.11)
RDW: 12.7 % (ref 11.5–15.5)
WBC: 8.3 10*3/uL (ref 4.0–10.5)
nRBC: 0 % (ref 0.0–0.2)

## 2020-12-30 LAB — URINALYSIS, ROUTINE W REFLEX MICROSCOPIC
Bilirubin Urine: NEGATIVE
Glucose, UA: NEGATIVE mg/dL
Hgb urine dipstick: NEGATIVE
Ketones, ur: NEGATIVE mg/dL
Leukocytes,Ua: NEGATIVE
Nitrite: NEGATIVE
Protein, ur: NEGATIVE mg/dL
Specific Gravity, Urine: 1.021 (ref 1.005–1.030)
pH: 6 (ref 5.0–8.0)

## 2020-12-30 LAB — TYPE AND SCREEN
ABO/RH(D): A POS
Antibody Screen: NEGATIVE

## 2020-12-30 LAB — RESP PANEL BY RT-PCR (FLU A&B, COVID) ARPGX2
Influenza A by PCR: NEGATIVE
Influenza B by PCR: NEGATIVE
SARS Coronavirus 2 by RT PCR: NEGATIVE

## 2020-12-30 MED ORDER — MISOPROSTOL 50MCG HALF TABLET: 50.0000 ug | ORAL_TABLET | ORAL | Status: DC | PRN

## 2020-12-30 MED ORDER — PENICILLIN G POT IN DEXTROSE 60000 UNIT/ML IV SOLN
3.0000 10*6.[IU] | INTRAVENOUS | Status: DC
  Administered 2020-12-31 – 2021-01-01 (×9): 3 10*6.[IU] via INTRAVENOUS
  Filled 2020-12-30 (×9): qty 50

## 2020-12-30 MED ORDER — ONDANSETRON HCL 4 MG/2ML IJ SOLN
4.0000 mg | Freq: Four times a day (QID) | INTRAMUSCULAR | Status: DC | PRN
  Administered 2021-01-01: 17:00:00 4 mg via INTRAVENOUS

## 2020-12-30 MED ORDER — MISOPROSTOL 50MCG HALF TABLET
50.0000 ug | ORAL_TABLET | ORAL | Status: DC | PRN
  Administered 2020-12-30 – 2020-12-31 (×2): 50 ug via ORAL
  Filled 2020-12-30 (×2): qty 1

## 2020-12-30 MED ORDER — LACTATED RINGERS IV SOLN: 500.0000 mL | INTRAVENOUS | Status: DC | PRN

## 2020-12-30 MED ORDER — ACETAMINOPHEN 325 MG PO TABS: 650.0000 mg | ORAL_TABLET | ORAL | Status: DC | PRN

## 2020-12-30 MED ORDER — SODIUM CHLORIDE 0.9 % IV SOLN
5.0000 10*6.[IU] | Freq: Once | INTRAVENOUS | Status: AC
  Administered 2020-12-30: 23:00:00 5 10*6.[IU] via INTRAVENOUS
  Filled 2020-12-30: qty 5

## 2020-12-30 MED ORDER — OXYCODONE-ACETAMINOPHEN 5-325 MG PO TABS: 2.0000 | ORAL_TABLET | ORAL | Status: DC | PRN

## 2020-12-30 MED ORDER — LACTATED RINGERS IV SOLN: INTRAVENOUS | Status: DC

## 2020-12-30 MED ORDER — OXYCODONE-ACETAMINOPHEN 5-325 MG PO TABS: 1.0000 | ORAL_TABLET | ORAL | Status: DC | PRN

## 2020-12-30 MED ORDER — TERBUTALINE SULFATE 1 MG/ML IJ SOLN
0.2500 mg | Freq: Once | INTRAMUSCULAR | Status: AC | PRN
  Administered 2020-12-31: 02:00:00 0.25 mg via SUBCUTANEOUS
  Filled 2020-12-30: qty 1

## 2020-12-30 MED ORDER — OXYTOCIN BOLUS FROM INFUSION: 333.0000 mL | Freq: Once | INTRAVENOUS | Status: DC

## 2020-12-30 MED ORDER — LIDOCAINE HCL (PF) 1 % IJ SOLN: 30.0000 mL | INTRAMUSCULAR | Status: DC | PRN

## 2020-12-30 MED ORDER — SOD CITRATE-CITRIC ACID 500-334 MG/5ML PO SOLN
30.0000 mL | ORAL | Status: DC | PRN
  Administered 2021-01-01: 17:00:00 30 mL via ORAL
  Filled 2020-12-30: qty 30

## 2020-12-30 MED ORDER — FLEET ENEMA 7-19 GM/118ML RE ENEM: 1.0000 | ENEMA | RECTAL | Status: DC | PRN

## 2020-12-30 MED ORDER — OXYTOCIN-SODIUM CHLORIDE 30-0.9 UT/500ML-% IV SOLN
2.5000 [IU]/h | INTRAVENOUS | Status: DC
  Filled 2020-12-30: qty 500

## 2020-12-30 NOTE — MAU Note (Signed)
Patient presents from office with NRNST and HTN.  POC for IOL. Reports positive fetal movement

## 2020-12-30 NOTE — MAU Note (Signed)
Presents for EFM, had non-reactive NST @ office visit.  Denies VB or LOF.  Endorses +FM>

## 2020-12-30 NOTE — Progress Notes (Signed)
Pt presents for ROB, NST, wants cx check today. Pt requests IOL this week.

## 2020-12-30 NOTE — Progress Notes (Signed)
° ° °  Subjective:  Amy Elliott is a 20 y.o. G1P0000 at [redacted]w[redacted]d being seen today for ongoing prenatal care.  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  Patient reports feeling well. Feeling contractions on occasion but not much.  Contractions: Irritability. Vag. Bleeding: None.  Movement: Present. Denies leaking of fluid.   The following portions of the patient's history were reviewed and updated as appropriate: allergies, current medications, past family history, past medical history, past social history, past surgical history and problem list.   Objective:   Vitals:   12/30/20 1042 12/30/20 1045  BP: (!) 143/88 138/83  Pulse: 75 76  Weight: 286 lb 14.4 oz (130.1 kg)     Movement: Present  Presentation: Vertex  General:  Alert, oriented and cooperative. Patient is in no acute distress.  Skin: Skin is warm and dry. No rash noted.   Cardiovascular: Normal heart rate noted  Respiratory: Normal respiratory effort, no problems with respiration noted  Abdomen: Soft, gravid, appropriate for gestational age. Pain/Pressure: Present     Pelvic:  Cervical exam performed in the presence of a chaperone Dilation: 1 Effacement (%): 30 Station: -3  Extremities: Normal range of motion.  Edema: None  Mental Status: Normal mood and affect. Normal behavior. Normal judgment and thought content.   NST:  Baseline 140bpm Moderate variability One 10x10 acceleration  No  decels  Ctx every 2-3 min on toco but not feeling them    Assessment and Plan:  Pregnancy: G1P0000 at [redacted]w[redacted]d  1. Encounter for supervision of normal first pregnancy in third trimester Feeling well with normal fetal movement. Desires waterbirth, discussed exclusions including (not limited) if her BP persistently stays elevated or necessity for continued augmentation if requiring IOL.   2. [redacted] weeks gestation of pregnancy NST reassuring but non-reactive, reviewed by myself and Dr. Donavan Foil. Sent to MAU for further continuous monitoring. If still non-reactive or non-reassuring BPP, will need IOL. If reactive/reassuring, will get her scheduled for pd IOL on Thursday or Friday if not spontaneous labor prior to.   3. GBS (group B Streptococcus carrier), +RV culture, currently pregnant PCN in labor.   4. Anemia of mother in pregnancy, antepartum Cont ferrous sulfate.   5. Transient elevated blood pressure BP initially elevated but WNL on recheck 3 minutes later, similar situation during last visit. Pre-e labs WNL at that time. If persistently elevated while in MAU, proceed with IOL.   Term labor symptoms and general obstetric precautions including but not limited to vaginal bleeding, contractions, leaking of fluid and fetal movement were reviewed in detail with the patient. Please refer to After Visit Summary for other counseling recommendations.    Allayne Stack, DO

## 2020-12-30 NOTE — H&P (Signed)
OBSTETRIC ADMISSION HISTORY AND PHYSICAL  Amy Elliott is a 20 y.o. female G1P0000 with IUP at [redacted]w[redacted]d by LMP presenting for IOL for elevated blood pressures post dates. She reports +FMs, No LOF, no VB, no blurry vision, headaches or peripheral edema, and RUQ pain.  She plans on breast feeding. She request POPs for birth control.  She was found to have elevated blood pressures in the office today and again on 11/29. She denies any HA, visual changes or epigastric pain  She received her prenatal care at  [facility]: By LMP --->  Estimated Date of Delivery: 12/28/20  Sono:    '@[redacted]w[redacted]d'$ , CWD, normal anatomy, cephalic presentation, 9038B, 75% EFW    Nursing Staff Provider  Office Location  [facility] Dating  LMP  Language  English Anatomy US  normal  Flu Vaccine  Declined 11/19/20 Genetic/Carrier Screen  NIPS:   Low Risk AFP:   Screen neg Horizon: Negative  TDaP Vaccine   declined 11/19/20 Hgb A1C or  GTT Early : 5.1 Third trimester Normal  Glucose, Fasting 65 - 91 mg/dL 80   Glucose, 1 hour 65 - 179 mg/dL 112   Glucose, 2 hour 65 - 152 mg/dL 89     COVID Vaccine Not vaccinated   LAB RESULTS   Rhogam  A+ Blood Type A/Positive/-- (06/09 1608)   Baby Feeding Plan Breast and Bottle Antibody Negative (06/09 1608)  Contraception PILLS Rubella 1.68 (06/09 1608)  Circumcision Yes if a boy RPR Non Reactive (09/28 1148)   Pediatrician  [facility]  HBsAg Negative (06/09 1608)   Support Person Rashawn-FOB HCVAb Negative  Prenatal Classes no HIV Non Reactive (09/28 1148)     BTL Consent  GBS Positive/-- (11/21 1511) (For PCN allergy, check sensitivities)   VBAC Consent  Pap 06/19/20 - ASCUS - [facility] 1 year follow-up       BP Cuff Ordered 06/06/20 Waterbirth  $RemoveBef'[x]'cGHDzgqreb$  Class $Remo'[ ]'UngWq$  Consent $Remove'[ ]'MyiHewV$  CNM visit  PHQ9 & GAD7 [ X ] new OB [ x ] 28 weeks  [ x] 36 weeks - 5/3 Induction  $RemoveBe'[ ]'olIqLuRON$  Orders Entered $RemoveBefore'[ ]'czFSAOzKOsmDl$ Foley Y/N   Prenatal History/Complications: anemia, obesity, generalized anxiety and depression  Past  Medical History: Past Medical History:  Diagnosis Date   Anemia     Past Surgical History: Past Surgical History:  Procedure Laterality Date   WISDOM TOOTH EXTRACTION  2020    Obstetrical History: OB History     Gravida  1   Para  0   Term  0   Preterm  0   AB  0   Living  0      SAB      IAB      Ectopic      Multiple      Live Births              Social History Social History   Socioeconomic History   Marital status: Single    Spouse name: Not on file   Number of children: Not on file   Years of education: Not on file   Highest education level: Not on file  Occupational History   Not on file  Tobacco Use   Smoking status: Never   Smokeless tobacco: Never  Vaping Use   Vaping Use: Never used  Substance and Sexual Activity   Alcohol use: Not Currently    Comment: not since confirmed pregnancy. Last drink in January   Drug use:  Not Currently    Types: Marijuana    Comment: last used March   Sexual activity: Yes    Partners: Male    Birth control/protection: None  Other Topics Concern   Not on file  Social History Narrative   Patient's number is [phone] (mother's is [phone])   Social Determinants of Health   Financial Resource Strain: Not on file  Food Insecurity: Not on file  Transportation Needs: Not on file  Physical Activity: Not on file  Stress: Not on file  Social Connections: Not on file    Family History: Family History  Problem Relation Age of Onset   Hypertension Mother    Varicose Veins Father    Diabetes Maternal Grandfather     Allergies: Allergies  Allergen Reactions   Banana    Other Itching    Grapes with seeds    Medications Prior to Admission  Medication Sig Dispense Refill Last Dose   acetaminophen (TYLENOL) 500 MG tablet Take 1,000 mg by mouth every 6 (six) hours as needed for headache.      aspirin EC 81 MG tablet Take 1 tablet (81 mg total) by mouth daily. 60 tablet 2    Blood  Pressure Monitoring (BLOOD PRESSURE KIT) DEVI 1 kit by Does not apply route once a week. 1 each 0    Iron Polysacch Cmplx-B12-FA 150-0.025-1 MG CAPS Take 1 capsule by mouth every other day. 30 capsule 5    Prenat-Fe Poly-Methfol-FA-DHA (VITAFOL ULTRA) 29-0.6-0.4-200 MG CAPS Take 1 tablet by mouth daily. 90 capsule 3    Review of Systems   All systems reviewed and negative except as stated in HPI  Blood pressure (!) 160/78, pulse 79, temperature 98.4 F (36.9 C), temperature source Oral, resp. rate 18, height $RemoveBe'5\' 6"'UEHElcNMD$  (1.676 m), weight 129.4 kg, last menstrual period 03/23/2020, SpO2 100 %, unknown if currently breastfeeding. General appearance: alert, cooperative, and no distress Lungs: clear to auscultation bilaterally Heart: regular rate and rhythm Abdomen: soft, non-tender; bowel sounds normal Pelvic: n/a Extremities: Homans sign is negative, no sign of DVT DTR's +2 Presentation: cephalic Fetal monitoringBaseline: 130 bpm, Variability: Good {> 6 bpm), Accelerations: Reactive, and Decelerations: Absent Uterine activityNone     Prenatal labs: ABO, Rh: A/Positive/-- (06/09 1608) Antibody: Negative (06/09 1608) Rubella: 1.68 (06/09 1608) RPR: Non Reactive (09/28 1148)  HBsAg: Negative (06/09 1608)  HIV: Non Reactive (09/28 1148)  GBS: Positive/-- (11/21 1511)   Prenatal Transfer Tool  Maternal Diabetes: No Genetic Screening: Normal Maternal Ultrasounds/Referrals: Normal Fetal Ultrasounds or other Referrals:  None Maternal Substance Abuse:  No Significant Maternal Medications:  None Significant Maternal Lab Results: Group B Strep positive  No results found for this or any previous visit (from the past 24 hour(s)).  Patient Active Problem List   Diagnosis Date Noted   Elevated blood pressure reading 12/09/2020   GBS (group B Streptococcus carrier), +RV culture, currently pregnant 12/08/2020   Atypical squamous cell changes of undetermined significance (ASCUS) on vaginal  cytology 06/23/2020   Anemia of mother in pregnancy, antepartum 06/22/2020   Obesity in pregnancy, antepartum 06/22/2020   Encounter for supervision of normal first pregnancy in third trimester 06/06/2020   Moderate episode of recurrent major depressive disorder ([place]) 04/07/2017   Generalized anxiety disorder 04/07/2017   Sleep difficulties 04/07/2017   Morbid obesity ([place]) 08/09/2016    Assessment/Plan:  Amy Elliott is a 20 y.o. G1P0000 at [redacted]w[redacted]d here for IOL for elevated blood pressures at post dates.  Due to high census  in labor and delivery, patient being held in MAU until bed space is available.  #Labor: Discussed recommendations for IOL with patient. Patient verbalized understanding. Discussed that at this point she risks out of waterbirth and is no longer a candidate. IOL methods reviewed at length.  #Pain: Planning unmedicated delivery #FWB: Cat 1 #ID:  GBS pos, PCN #MOF: breast #MOC: POP #Circ:  [place], CNM  12/30/2020, 12:33 PM

## 2020-12-30 NOTE — Progress Notes (Addendum)
S: Patient resting comfortable in bed with no complaints. Significant other at bedside. Anticipating the arrival of [name].   O: Vitals:   12/30/20 2220 12/30/20 2224 12/30/20 2231 12/30/20 2301  BP:  138/69 (!) 144/73 139/73  Pulse:  81 81 81  Resp: 20     Temp: 97.8 F (36.6 C)     TempSrc: Oral     SpO2:      Weight:      Height:        FHT:  FHR: 145 bpm, variability: moderate,  accelerations:  Present,  decelerations:  Absent UC:   occassional  SVE:   Dilation: 1 Effacement (%): 50 Station: -3 Exam by:: Amy Elliott, CNM  A / P: Amy Elliott is a 20 y.o. G1P0000 at [redacted]w[redacted]d here for IOL GHTN, foley balloon placed at 2248.   PO Cytotec for cervical ripening  GBS positive on PCN Fetal Wellbeing:  Category I Pain Control:  Labor support without medications, does not plan on getting epidural Anticipated MOD:  NSVD  Amy Elliott, SNM 12/30/2020, 11:10 PM  Attestation:  I confirm that I have verified the information documented in the  SNM s note and that I have also personally reperformed the physical exam and all medical decision making activities.     Foley Balloon placed by me Will proceed with oral cytotec Amy Elliott, CNM

## 2020-12-30 NOTE — Progress Notes (Signed)
Amy Elliott 20 y.o. G1P0000 at [redacted]w[redacted]d   Subjective: Patient denies headache, visual disturbances, RUQ/epigastric pain, new onset swelling or weight gain  Objective:  Patient Vitals for the past 24 hrs:  BP Temp Temp src Pulse Resp SpO2 Height Weight  12/30/20 1703 130/75 -- -- 76 -- 100 % -- --  12/30/20 1646 129/70 -- -- 77 -- -- -- --  12/30/20 1631 (!) 110/49 -- -- 77 -- -- -- --  12/30/20 1616 (!) 120/59 -- -- 78 -- -- -- --  12/30/20 1602 (!) 104/48 -- -- 93 -- -- -- --  12/30/20 1546 (!) 141/75 -- -- 76 -- -- -- --  12/30/20 1545 -- -- -- -- -- 100 % -- --  12/30/20 1540 -- -- -- -- -- 100 % -- --  12/30/20 1535 -- -- -- -- -- 100 % -- --  12/30/20 1531 (!) 143/75 -- -- 83 -- -- -- --  12/30/20 1516 129/85 -- -- 78 -- -- -- --  12/30/20 1501 (!) 141/76 -- -- 82 -- -- -- --  12/30/20 1445 (!) 148/85 -- -- 75 -- 100 % -- --  12/30/20 1430 (!) 141/79 -- -- 79 -- 100 % -- --  12/30/20 1415 (!) 158/76 -- -- 73 -- 100 % -- --  12/30/20 1400 (!) 160/77 -- -- 70 20 100 % -- --  12/30/20 1345 (!) 146/88 -- -- 79 -- 100 % -- --  12/30/20 1330 (!) 127/95 -- -- 82 -- 100 % -- --  12/30/20 1315 (!) 146/94 -- -- 91 -- 100 % -- --  12/30/20 1300 (!) 142/92 -- -- 81 -- 100 % -- --  12/30/20 1246 (!) 150/78 -- -- 85 20 -- -- --  12/30/20 1223 (!) 160/78 98.4 F (36.9 C) Oral 79 18 100 % -- --  12/30/20 1219 -- -- -- -- -- -- 5\' 6"  (1.676 m) 129.4 kg   Results for orders placed or performed during the hospital encounter of 12/30/20 (from the past 24 hour(s))  Urinalysis, Routine w reflex microscopic Urine, Clean Catch     Status: None   Collection Time: 12/30/20 12:33 PM  Result Value Ref Range   Color, Urine YELLOW YELLOW   APPearance CLEAR CLEAR   Specific Gravity, Urine 1.021 1.005 - 1.030   pH 6.0 5.0 - 8.0   Glucose, UA NEGATIVE NEGATIVE mg/dL   Hgb urine dipstick NEGATIVE NEGATIVE   Bilirubin Urine NEGATIVE NEGATIVE   Ketones, ur NEGATIVE NEGATIVE mg/dL   Protein, ur NEGATIVE  NEGATIVE mg/dL   Nitrite NEGATIVE NEGATIVE   Leukocytes,Ua NEGATIVE NEGATIVE  Protein / creatinine ratio, urine     Status: None   Collection Time: 12/30/20  1:28 PM  Result Value Ref Range   Creatinine, Urine 205.69 mg/dL   Total Protein, Urine 21 mg/dL   Protein Creatinine Ratio 0.10 0.00 - 0.15 mg/mg[Cre]  CBC     Status: Abnormal   Collection Time: 12/30/20  1:30 PM  Result Value Ref Range   WBC 8.3 4.0 - 10.5 K/uL   RBC 3.63 (L) 3.87 - 5.11 MIL/uL   Hemoglobin 10.4 (L) 12.0 - 15.0 g/dL   HCT 01/01/21 (L) 16.1 - 09.6 %   MCV 84.8 80.0 - 100.0 fL   MCH 28.7 26.0 - 34.0 pg   MCHC 33.8 30.0 - 36.0 g/dL   RDW 04.5 40.9 - 81.1 %   Platelets 270 150 - 400 K/uL   nRBC 0.0 0.0 -  0.2 %  Type and screen  [facility]     Status: None   Collection Time: 12/30/20  1:30 PM  Result Value Ref Range   ABO/RH(D) A POS    Antibody Screen NEG    Sample Expiration      01/02/2021,2359 Performed at [facility], [address], [place], [place] [address]   Comprehensive metabolic panel     Status: Abnormal   Collection Time: 12/30/20  1:30 PM  Result Value Ref Range   Sodium 135 135 - 145 mmol/L   Potassium 3.8 3.5 - 5.1 mmol/L   Chloride 109 98 - 111 mmol/L   CO2 21 (L) 22 - 32 mmol/L   Glucose, Bld 79 70 - 99 mg/dL   BUN 7 6 - 20 mg/dL   Creatinine, Ser 3.47 0.44 - 1.00 mg/dL   Calcium 8.6 (L) 8.9 - 10.3 mg/dL   Total Protein 6.0 (L) 6.5 - 8.1 g/dL   Albumin 2.5 (L) 3.5 - 5.0 g/dL   AST 19 15 - 41 U/L   ALT 12 0 - 44 U/L   Alkaline Phosphatase 141 (H) 38 - 126 U/L   Total Bilirubin 0.4 0.3 - 1.2 mg/dL   GFR, Estimated >42 >59 mL/min   Anion gap 5 5 - 15  Resp Panel by RT-PCR (Flu A&B, Covid) Nasopharyngeal Swab     Status: None   Collection Time: 12/30/20  1:31 PM   Specimen: Nasopharyngeal Swab; Nasopharyngeal(NP) swabs in vial transport medium  Result Value Ref Range   SARS Coronavirus 2 by RT PCR NEGATIVE NEGATIVE   Influenza A by PCR NEGATIVE NEGATIVE    Influenza B by PCR NEGATIVE NEGATIVE   A/P --CNM at bedside in MAU to apologize for wait due to lack of bedspace, obtain updated assessment --GHTN, PEC labs WNL --Cat I tracing --GBS POS: PCN ordered --Preemptive discussion foley balloon and Cytotec for cervical ripening --Patient denies questions --Anticipate vaginal birth  Clayton Bibles, MSA, MSN, CNM Certified Nurse Midwife, [employer] for [employer], [facility] 12/30/20 6:35 PM

## 2020-12-31 LAB — RPR
RPR Ser Ql: REACTIVE — AB
RPR Titer: 1:1 {titer}

## 2020-12-31 MED ORDER — FENTANYL CITRATE (PF) 100 MCG/2ML IJ SOLN
50.0000 ug | INTRAMUSCULAR | Status: DC | PRN
  Administered 2020-12-31 (×2): 50 ug via INTRAVENOUS
  Filled 2020-12-31 (×2): qty 2

## 2020-12-31 MED ORDER — TERBUTALINE SULFATE 1 MG/ML IJ SOLN: 0.2500 mg | Freq: Once | INTRAMUSCULAR | Status: DC | PRN

## 2020-12-31 MED ORDER — OXYTOCIN-SODIUM CHLORIDE 30-0.9 UT/500ML-% IV SOLN
1.0000 m[IU]/min | INTRAVENOUS | Status: DC
  Administered 2020-12-31: 14:00:00 2 m[IU]/min via INTRAVENOUS
  Administered 2021-01-01: 17:00:00 30 [IU] via INTRAVENOUS
  Filled 2020-12-31: qty 500

## 2020-12-31 NOTE — Progress Notes (Signed)
Patient ID: Amy Elliott, female   DOB: 2000-07-10, 20 y.o.   MRN: [id number]  S/p cervical foley and cytotec x 1 dose last PM; overall feeling well  BPs 137/62, 144/78 FHR 130-140s, +accels, no decels Ctx q 3-6 mins Cx post/high/cx 2cm/soft/thick; PP not felt but vtx verified by bedside u/s  IUP@40 .3wks gHTN- neg labs Cx unfavorable  A second dose of cytotec was given buccally; plan for Pitocin in 4hrs  Arabella Merles [facility] 12/31/2020

## 2020-12-31 NOTE — Progress Notes (Signed)
Patient ID: Amy Elliott, female   DOB: 03-27-2000, 20 y.o.   MRN: [id number]  S/p cytotec x 2nd dose this morning; Pit started at 1415  BPs 154/85, 151/88 FHR 125-135, +accels, no decels, occ variables Ctx q 1-4 mins with Pit at 42mu/min Cx 4/50/vtx -3 at start of Pit  IUP@40 .3wks gHTN IOL process  -Continue to uptitrate Pit to achieve adequate labor -Anticipate vag del  Arabella Merles CNM 12/31/2020

## 2020-12-31 NOTE — Progress Notes (Signed)
Patient ID: Amy Elliott, female   DOB: [birth year], 20 y.o.   MRN: [id number]  States ctx are getting stronger  BPs 146/97, 142/77 FHR 130-140s, +accels, no decels Ctx q 1-4 mins, coupling; Pit at 33mu/min Cx 5/60/vtx -3 per RN exam 1954  IUP@40 .3wks gHTN IOL process  Keep ctx reg with Pitocin; consider AROM vs Pit break as time passes if there isn't significant change  Arabella Merles [facility] 12/31/2020 9:25 PM

## 2020-12-31 NOTE — Progress Notes (Signed)
Patient ID: Amy Elliott, female   DOB: 2000-02-08, 20 y.o.   MRN: [id number] Called to room for spontaneous prolonged deceleration  Vitals:   12/30/20 2301 12/31/20 0000 12/31/20 0101 12/31/20 0201  BP: 139/73 (!) 152/92 131/66 131/65  Pulse: 81 86 69 71  Resp:      Temp:      TempSrc:      SpO2:      Weight:      Height:       At around 0215, FHR had been 140=150 There was a spontaneous decel to 70s for several minutes.  Frequent uterine contractions.  RN had changed positions from side to standing.  We then got her on hands and knees and gave Terbutaline  Dilation: 1 Effacement (%): 50 Station: -3 Presentation: Vertex Exam by:: Antony Blackbird, CNM  FHR resumed 150s with good variability and accels Will allow fetus to rest. UCs now spaced out

## 2021-01-01 ENCOUNTER — Inpatient Hospital Stay (HOSPITAL_COMMUNITY): Payer: Medicaid Other | Admitting: Anesthesiology

## 2021-01-01 ENCOUNTER — Encounter (HOSPITAL_COMMUNITY)
Admission: AD | Disposition: A | Discharge status: Home / Self Care | Payer: Self-pay | Source: Home / Self Care | Attending: Family Medicine

## 2021-01-01 ENCOUNTER — Encounter (HOSPITAL_COMMUNITY): Payer: Self-pay | Admitting: Family Medicine

## 2021-01-01 DIAGNOSIS — O99214 Obesity complicating childbirth: Secondary | ICD-10-CM | POA: Diagnosis not present

## 2021-01-01 DIAGNOSIS — O134 Gestational [pregnancy-induced] hypertension without significant proteinuria, complicating childbirth: Secondary | ICD-10-CM | POA: Diagnosis not present

## 2021-01-01 DIAGNOSIS — Z3A40 40 weeks gestation of pregnancy: Secondary | ICD-10-CM | POA: Diagnosis not present

## 2021-01-01 DIAGNOSIS — Z3A00 Weeks of gestation of pregnancy not specified: Secondary | ICD-10-CM | POA: Diagnosis not present

## 2021-01-01 DIAGNOSIS — O9081 Anemia of the puerperium: Secondary | ICD-10-CM | POA: Diagnosis not present

## 2021-01-01 DIAGNOSIS — O99820 Streptococcus B carrier state complicating pregnancy: Secondary | ICD-10-CM | POA: Diagnosis not present

## 2021-01-01 DIAGNOSIS — Z7982 Long term (current) use of aspirin: Secondary | ICD-10-CM | POA: Diagnosis not present

## 2021-01-01 DIAGNOSIS — D62 Acute posthemorrhagic anemia: Secondary | ICD-10-CM | POA: Diagnosis not present

## 2021-01-01 DIAGNOSIS — O2293 Venous complication in pregnancy, unspecified, third trimester: Secondary | ICD-10-CM | POA: Diagnosis not present

## 2021-01-01 DIAGNOSIS — O411230 Chorioamnionitis, third trimester, not applicable or unspecified: Secondary | ICD-10-CM | POA: Diagnosis not present

## 2021-01-01 DIAGNOSIS — Z20822 Contact with and (suspected) exposure to covid-19: Secondary | ICD-10-CM | POA: Diagnosis not present

## 2021-01-01 DIAGNOSIS — O99824 Streptococcus B carrier state complicating childbirth: Secondary | ICD-10-CM | POA: Diagnosis not present

## 2021-01-01 DIAGNOSIS — O480 Post-term pregnancy: Secondary | ICD-10-CM | POA: Diagnosis not present

## 2021-01-01 LAB — T.PALLIDUM AB, TOTAL: T Pallidum Abs: NONREACTIVE

## 2021-01-01 SURGERY — Surgical Case
Anesthesia: Regional

## 2021-01-01 SURGERY — Surgical Case
Anesthesia: Spinal

## 2021-01-01 MED ORDER — DIPHENHYDRAMINE HCL 25 MG PO CAPS: 25.0000 mg | ORAL_CAPSULE | Freq: Four times a day (QID) | ORAL | Status: DC | PRN

## 2021-01-01 MED ORDER — COCONUT OIL OIL
1.0000 "application " | TOPICAL_OIL | Status: DC | PRN
  Administered 2021-01-03: 1 via TOPICAL

## 2021-01-01 MED ORDER — MEASLES, MUMPS & RUBELLA VAC IJ SOLR: 0.5000 mL | Freq: Once | INTRAMUSCULAR | Status: DC

## 2021-01-01 MED ORDER — SODIUM CHLORIDE 0.9 % IV SOLN
INTRAVENOUS | Status: AC
  Filled 2021-01-01: qty 5

## 2021-01-01 MED ORDER — MORPHINE SULFATE (PF) 0.5 MG/ML IJ SOLN
INTRAMUSCULAR | Status: DC | PRN
  Administered 2021-01-01: .15 mg via INTRATHECAL

## 2021-01-01 MED ORDER — CEFAZOLIN IN SODIUM CHLORIDE 3-0.9 GM/100ML-% IV SOLN
INTRAVENOUS | Status: AC
  Filled 2021-01-01: qty 100

## 2021-01-01 MED ORDER — LACTATED RINGERS IV SOLN: INTRAVENOUS | Status: DC | PRN

## 2021-01-01 MED ORDER — ACETAMINOPHEN 500 MG PO TABS: 1000.0000 mg | ORAL_TABLET | Freq: Four times a day (QID) | ORAL | Status: DC

## 2021-01-01 MED ORDER — FENTANYL CITRATE (PF) 100 MCG/2ML IJ SOLN
INTRAMUSCULAR | Status: AC
  Filled 2021-01-01: qty 2

## 2021-01-01 MED ORDER — FUROSEMIDE 20 MG PO TABS
20.0000 mg | ORAL_TABLET | Freq: Every day | ORAL | Status: DC
  Administered 2021-01-02: 09:00:00 20 mg via ORAL
  Filled 2021-01-01: qty 1

## 2021-01-01 MED ORDER — DIPHENHYDRAMINE HCL 50 MG/ML IJ SOLN: 12.5000 mg | INTRAMUSCULAR | Status: DC | PRN

## 2021-01-01 MED ORDER — TETANUS-DIPHTH-ACELL PERTUSSIS 5-2.5-18.5 LF-MCG/0.5 IM SUSY: 0.5000 mL | PREFILLED_SYRINGE | Freq: Once | INTRAMUSCULAR | Status: DC

## 2021-01-01 MED ORDER — SCOPOLAMINE 1 MG/3DAYS TD PT72
1.0000 | MEDICATED_PATCH | Freq: Once | TRANSDERMAL | Status: AC
  Administered 2021-01-01: 19:00:00 1.5 mg via TRANSDERMAL

## 2021-01-01 MED ORDER — STERILE WATER FOR IRRIGATION IR SOLN
Status: DC | PRN
  Administered 2021-01-01: 1000 mL

## 2021-01-01 MED ORDER — KETOROLAC TROMETHAMINE 30 MG/ML IJ SOLN
30.0000 mg | Freq: Four times a day (QID) | INTRAMUSCULAR | Status: AC
  Administered 2021-01-02 (×3): 30 mg via INTRAVENOUS
  Filled 2021-01-01 (×3): qty 1

## 2021-01-01 MED ORDER — SIMETHICONE 80 MG PO CHEW
80.0000 mg | CHEWABLE_TABLET | Freq: Three times a day (TID) | ORAL | Status: DC
  Administered 2021-01-02 – 2021-01-04 (×8): 80 mg via ORAL
  Filled 2021-01-01 (×8): qty 1

## 2021-01-01 MED ORDER — OXYCODONE HCL 5 MG PO TABS
5.0000 mg | ORAL_TABLET | ORAL | Status: DC | PRN
  Administered 2021-01-03 – 2021-01-04 (×4): 5 mg via ORAL
  Filled 2021-01-01 (×4): qty 1

## 2021-01-01 MED ORDER — KETOROLAC TROMETHAMINE 30 MG/ML IJ SOLN: 30.0000 mg | Freq: Four times a day (QID) | INTRAMUSCULAR | Status: AC | PRN

## 2021-01-01 MED ORDER — DIBUCAINE (PERIANAL) 1 % EX OINT: 1.0000 "application " | TOPICAL_OINTMENT | CUTANEOUS | Status: DC | PRN

## 2021-01-01 MED ORDER — FENTANYL CITRATE (PF) 100 MCG/2ML IJ SOLN
100.0000 ug | INTRAMUSCULAR | Status: DC | PRN
  Administered 2021-01-01 (×7): 100 ug via INTRAVENOUS
  Filled 2021-01-01 (×7): qty 2

## 2021-01-01 MED ORDER — MEDROXYPROGESTERONE ACETATE 150 MG/ML IM SUSP: 150.0000 mg | INTRAMUSCULAR | Status: DC | PRN

## 2021-01-01 MED ORDER — FENTANYL CITRATE (PF) 100 MCG/2ML IJ SOLN: 25.0000 ug | INTRAMUSCULAR | Status: DC | PRN

## 2021-01-01 MED ORDER — NALOXONE HCL 0.4 MG/ML IJ SOLN: 0.4000 mg | INTRAMUSCULAR | Status: DC | PRN

## 2021-01-01 MED ORDER — GABAPENTIN 100 MG PO CAPS
300.0000 mg | ORAL_CAPSULE | Freq: Every day | ORAL | Status: DC
  Administered 2021-01-01 – 2021-01-03 (×3): 300 mg via ORAL
  Filled 2021-01-01 (×3): qty 3

## 2021-01-01 MED ORDER — PHENYLEPHRINE HCL-NACL 20-0.9 MG/250ML-% IV SOLN
INTRAVENOUS | Status: DC | PRN
  Administered 2021-01-01: 60 ug/min via INTRAVENOUS

## 2021-01-01 MED ORDER — SODIUM CHLORIDE 0.9 % IR SOLN
Status: DC | PRN
  Administered 2021-01-01: 1

## 2021-01-01 MED ORDER — MORPHINE SULFATE (PF) 0.5 MG/ML IJ SOLN
INTRAMUSCULAR | Status: AC
  Filled 2021-01-01: qty 10

## 2021-01-01 MED ORDER — FENTANYL CITRATE (PF) 100 MCG/2ML IJ SOLN
INTRAMUSCULAR | Status: DC | PRN
  Administered 2021-01-01: 15 ug via INTRATHECAL

## 2021-01-01 MED ORDER — IBUPROFEN 600 MG PO TABS
600.0000 mg | ORAL_TABLET | Freq: Four times a day (QID) | ORAL | Status: DC
  Administered 2021-01-02 – 2021-01-04 (×8): 600 mg via ORAL
  Filled 2021-01-01 (×8): qty 1

## 2021-01-01 MED ORDER — ENOXAPARIN SODIUM 80 MG/0.8ML IJ SOSY
0.5000 mg/kg | PREFILLED_SYRINGE | INTRAMUSCULAR | Status: DC
  Administered 2021-01-02 – 2021-01-04 (×3): 65 mg via SUBCUTANEOUS
  Filled 2021-01-01 (×3): qty 0.8

## 2021-01-01 MED ORDER — PRENATAL MULTIVITAMIN CH
1.0000 | ORAL_TABLET | Freq: Every day | ORAL | Status: DC
  Administered 2021-01-02 – 2021-01-04 (×3): 1 via ORAL
  Filled 2021-01-01 (×3): qty 1

## 2021-01-01 MED ORDER — OXYTOCIN-SODIUM CHLORIDE 30-0.9 UT/500ML-% IV SOLN
2.5000 [IU]/h | INTRAVENOUS | Status: AC
  Administered 2021-01-02: 01:00:00 2.5 [IU]/h via INTRAVENOUS
  Filled 2021-01-01: qty 500

## 2021-01-01 MED ORDER — ACETAMINOPHEN 500 MG PO TABS
1000.0000 mg | ORAL_TABLET | Freq: Four times a day (QID) | ORAL | Status: DC
  Administered 2021-01-01 – 2021-01-04 (×11): 1000 mg via ORAL
  Filled 2021-01-01 (×11): qty 2

## 2021-01-01 MED ORDER — BUPIVACAINE IN DEXTROSE 0.75-8.25 % IT SOLN
INTRATHECAL | Status: DC | PRN
  Administered 2021-01-01: 1.6 mL via INTRATHECAL

## 2021-01-01 MED ORDER — MENTHOL 3 MG MT LOZG: 1.0000 | LOZENGE | OROMUCOSAL | Status: DC | PRN

## 2021-01-01 MED ORDER — KETOROLAC TROMETHAMINE 30 MG/ML IJ SOLN
30.0000 mg | Freq: Four times a day (QID) | INTRAMUSCULAR | Status: AC | PRN
  Administered 2021-01-01: 19:00:00 30 mg via INTRAMUSCULAR

## 2021-01-01 MED ORDER — OXYTOCIN-SODIUM CHLORIDE 30-0.9 UT/500ML-% IV SOLN
INTRAVENOUS | Status: AC
  Filled 2021-01-01: qty 500

## 2021-01-01 MED ORDER — KETOROLAC TROMETHAMINE 30 MG/ML IJ SOLN
INTRAMUSCULAR | Status: AC
  Filled 2021-01-01: qty 1

## 2021-01-01 MED ORDER — SCOPOLAMINE 1 MG/3DAYS TD PT72
MEDICATED_PATCH | TRANSDERMAL | Status: AC
  Filled 2021-01-01: qty 1

## 2021-01-01 MED ORDER — CEFAZOLIN IN SODIUM CHLORIDE 3-0.9 GM/100ML-% IV SOLN
3.0000 g | Freq: Once | INTRAVENOUS | Status: AC
  Administered 2021-01-01: 17:00:00 3 g via INTRAVENOUS

## 2021-01-01 MED ORDER — SIMETHICONE 80 MG PO CHEW: 80.0000 mg | CHEWABLE_TABLET | ORAL | Status: DC | PRN

## 2021-01-01 MED ORDER — ONDANSETRON HCL 4 MG/2ML IJ SOLN
INTRAMUSCULAR | Status: AC
  Filled 2021-01-01: qty 2

## 2021-01-01 MED ORDER — SENNOSIDES-DOCUSATE SODIUM 8.6-50 MG PO TABS
2.0000 | ORAL_TABLET | Freq: Every day | ORAL | Status: DC
  Administered 2021-01-02 – 2021-01-04 (×3): 2 via ORAL
  Filled 2021-01-01 (×3): qty 2

## 2021-01-01 MED ORDER — ONDANSETRON HCL 4 MG/2ML IJ SOLN: 4.0000 mg | Freq: Three times a day (TID) | INTRAMUSCULAR | Status: DC | PRN

## 2021-01-01 MED ORDER — DIPHENHYDRAMINE HCL 25 MG PO CAPS: 25.0000 mg | ORAL_CAPSULE | ORAL | Status: DC | PRN

## 2021-01-01 MED ORDER — MAGNESIUM HYDROXIDE 400 MG/5ML PO SUSP: 30.0000 mL | ORAL | Status: DC | PRN

## 2021-01-01 MED ORDER — SODIUM CHLORIDE 0.9% FLUSH: 3.0000 mL | INTRAVENOUS | Status: DC | PRN

## 2021-01-01 MED ORDER — SODIUM CHLORIDE 0.9 % IV SOLN
500.0000 mg | Freq: Once | INTRAVENOUS | Status: AC
  Administered 2021-01-01: 17:00:00 500 mg via INTRAVENOUS

## 2021-01-01 MED ORDER — WITCH HAZEL-GLYCERIN EX PADS: 1.0000 "application " | MEDICATED_PAD | CUTANEOUS | Status: DC | PRN

## 2021-01-01 MED ORDER — ACETAMINOPHEN 10 MG/ML IV SOLN: 1000.0000 mg | Freq: Once | INTRAVENOUS | Status: DC | PRN

## 2021-01-01 MED ORDER — NALOXONE HCL 4 MG/10ML IJ SOLN
1.0000 ug/kg/h | INTRAVENOUS | Status: DC | PRN
  Filled 2021-01-01: qty 5

## 2021-01-01 MED ORDER — LACTATED RINGERS IV SOLN: INTRAVENOUS | Status: DC

## 2021-01-01 MED ORDER — PHENYLEPHRINE HCL-NACL 20-0.9 MG/250ML-% IV SOLN
INTRAVENOUS | Status: AC
  Filled 2021-01-01: qty 250

## 2021-01-01 SURGICAL SUPPLY — 30 items
BENZOIN TINCTURE PRP APPL 2/3 (GAUZE/BANDAGES/DRESSINGS) ×3 IMPLANT
CHLORAPREP W/TINT 26ML (MISCELLANEOUS) ×3 IMPLANT
CLOSURE WOUND 1/2 X4 (GAUZE/BANDAGES/DRESSINGS) ×1
CLOTH BEACON ORANGE TIMEOUT ST (SAFETY) ×3 IMPLANT
DRESSING PREVENA PLUS CUSTOM (GAUZE/BANDAGES/DRESSINGS) IMPLANT
DRSG OPSITE POSTOP 4X10 (GAUZE/BANDAGES/DRESSINGS) ×3 IMPLANT
DRSG PREVENA PLUS CUSTOM (GAUZE/BANDAGES/DRESSINGS) ×3
ELECT REM PT RETURN 9FT ADLT (ELECTROSURGICAL) ×3
ELECTRODE REM PT RTRN 9FT ADLT (ELECTROSURGICAL) ×1 IMPLANT
GLOVE BIOGEL PI IND STRL 7.0 (GLOVE) ×3 IMPLANT
GLOVE BIOGEL PI INDICATOR 7.0 (GLOVE) ×6
GLOVE ECLIPSE 6.5 STRL STRAW (GLOVE) ×3 IMPLANT
GOWN STRL REUS W/ TWL LRG LVL3 (GOWN DISPOSABLE) ×2 IMPLANT
GOWN STRL REUS W/TWL LRG LVL3 (GOWN DISPOSABLE) ×4
NS IRRIG 1000ML POUR BTL (IV SOLUTION) ×3 IMPLANT
PAD OB MATERNITY 4.3X12.25 (PERSONAL CARE ITEMS) ×3 IMPLANT
PAD PREP 24X48 CUFFED NSTRL (MISCELLANEOUS) ×3 IMPLANT
RETRACTOR TRAXI PANNICULUS (MISCELLANEOUS) IMPLANT
RETRACTOR WND ALEXIS 25 LRG (MISCELLANEOUS) IMPLANT
RTRCTR WOUND ALEXIS 25CM LRG (MISCELLANEOUS)
STRIP CLOSURE SKIN 1/2X4 (GAUZE/BANDAGES/DRESSINGS) ×2 IMPLANT
SUT PLAIN 2 0 XLH (SUTURE) ×3 IMPLANT
SUT VIC AB 0 CT1 36 (SUTURE) ×6 IMPLANT
SUT VIC AB 2-0 CT1 27 (SUTURE) ×2
SUT VIC AB 2-0 CT1 TAPERPNT 27 (SUTURE) ×1 IMPLANT
SUT VIC AB 4-0 KS 27 (SUTURE) ×3 IMPLANT
TOWEL OR 17X24 6PK STRL BLUE (TOWEL DISPOSABLE) ×9 IMPLANT
TRAXI PANNICULUS RETRACTOR (MISCELLANEOUS) ×2
TRAY FOLEY CATH SILVER 16FR (SET/KITS/TRAYS/PACK) ×3 IMPLANT
WATER STERILE IRR 1000ML POUR (IV SOLUTION) ×3 IMPLANT

## 2021-01-01 NOTE — Lactation Note (Signed)
This note was copied from a baby's chart. Lactation Consultation Note  Patient Name: Amy Elliott TIWPY'K Date: 01/01/2021 Reason for consult: Initial assessment;1st time breastfeeding;Term Age:21 hours P1, term female infant. Mom's feeding choice is breat and supplementing infant with formula.  Per mom, she feels breastfeeding is doing well, infant breastfeeding for 15-20 minutes per feeding. LC entered the room, mom was doing skin to skin. Mom latched infant on her left breast using the football hold position, infant latched with depth, LC worked with mom on positioning infant and hand placement.  Infant actively breastfeed for 13 minutes, afterwards mom was doing skin to skin with infant as LC was leaving the room. Mom knows of she feels pinching or pain with latch and not a tug to break latch and re-latch infant at the breast. Mom made aware of O/P services, breastfeeding support groups, community resources, and our phone # for post-discharge questions.   Mom's plan: 1- Mom will continue to breastfeed infant according to feeding cues, 8 to 12 + times within 24 hours, skin to skin. 2- Mom knows to call RN/LC if she has any breastfeeding questions, concerns or needs further assistance with latching infant at the breast. 3- Mom will try latch infant on both breast during a feeding and continue with breastfeeding stimulation techniques to keep infant awake while breastfeeding. Maternal Data Does the patient have breastfeeding experience prior to this delivery?: No  Feeding Mother's Current Feeding Choice: Breast Milk and Formula  LATCH Score Latch: Grasps breast easily, tongue down, lips flanged, rhythmical sucking.  Audible Swallowing: Spontaneous and intermittent  Type of Nipple: Everted at rest and after stimulation  Comfort (Breast/Nipple): Soft / non-tender  Hold (Positioning): Assistance needed to correctly position infant at breast and maintain latch.  LATCH Score:  9   Lactation Tools Discussed/Used    Interventions Interventions: Breast feeding basics reviewed;Assisted with latch;Skin to skin;Breast compression;Adjust position;Support pillows;Position options;Education;LC Services brochure  Discharge    Consult Status Consult Status: Follow-up Date: 01/02/21 Follow-up type: In-patient    Danelle Earthly 01/01/2021, 9:52 PM

## 2021-01-01 NOTE — Transfer of Care (Signed)
Immediate Anesthesia Transfer of Care Note  Patient: Amy Elliott  Procedure(s) Performed: CESAREAN SECTION  Patient Location: PACU  Anesthesia Type:Spinal  Level of Consciousness: awake  Airway & Oxygen Therapy: Patient Spontanous Breathing  Post-op Assessment: Report given to RN and Post -op Vital signs reviewed and stable  Post vital signs: Reviewed and stable  Last Vitals:  Vitals Value Taken Time  BP 122/60 01/01/21 1835  Temp 36.7 C 01/01/21 1835  Pulse 71 01/01/21 1835  Resp 13 01/01/21 1835  SpO2 100 % 01/01/21 1835  Vitals shown include unvalidated device data.  Last Pain:  Vitals:   01/01/21 1325  TempSrc: Oral  PainSc:          Complications: No notable events documented.

## 2021-01-01 NOTE — Anesthesia Postprocedure Evaluation (Signed)
Anesthesia Post Note  Patient: Amy Elliott  Procedure(s) Performed: CESAREAN SECTION     Patient location during evaluation: PACU Anesthesia Type: Spinal Level of consciousness: oriented, awake and alert and awake Pain management: pain level controlled Vital Signs Assessment: post-procedure vital signs reviewed and stable Respiratory status: spontaneous breathing, respiratory function stable and nonlabored ventilation Cardiovascular status: blood pressure returned to baseline and stable Postop Assessment: no headache, no backache, no apparent nausea or vomiting and spinal receding Anesthetic complications: no   No notable events documented.  Last Vitals:  Vitals:   01/01/21 1935 01/01/21 1946  BP:  123/61  Pulse: 78 (!) 59  Resp: 19 18  Temp:  36.8 C  SpO2: 100% 100%    Last Pain:  Vitals:   01/01/21 1946  TempSrc: Oral  PainSc: 5    Pain Goal:    LLE Motor Response: Purposeful movement (01/01/21 1935) LLE Sensation: Tingling, Increased (01/01/21 1935) RLE Motor Response: Purposeful movement (01/01/21 1935) RLE Sensation: Tingling, Increased (01/01/21 1935)     Epidural/Spinal Function Cutaneous sensation: Vague (completely feel left; waking up on right) (01/01/21 1946), Patient able to flex knees: Yes (01/01/21 1935), Patient able to lift hips off bed: No (01/01/21 1935), Back pain beyond tenderness at insertion site: No (01/01/21 1935), Progressively worsening motor and/or sensory loss: No (01/01/21 1935), Bowel and/or bladder incontinence post epidural: No (01/01/21 1935)  Cecile Hearing

## 2021-01-01 NOTE — Anesthesia Preprocedure Evaluation (Signed)
Anesthesia Evaluation  Patient identified by MRN, date of birth, ID band Patient awake    Reviewed: Allergy & Precautions, NPO status , Patient's Chart, lab work & pertinent test results  Airway Mallampati: III  TM Distance: >3 FB Neck ROM: Full    Dental no notable dental hx.    Pulmonary neg pulmonary ROS,    Pulmonary exam normal breath sounds clear to auscultation       Cardiovascular hypertension (gHTN), Normal cardiovascular exam Rhythm:Regular Rate:Normal     Neuro/Psych negative neurological ROS  negative psych ROS   GI/Hepatic negative GI ROS, Neg liver ROS,   Endo/Other  Morbid obesity (BMI 46)  Renal/GU negative Renal ROS  negative genitourinary   Musculoskeletal negative musculoskeletal ROS (+)   Abdominal   Peds  Hematology negative hematology ROS (+)   Anesthesia Other Findings Initially presented for IOL for gHTN now with fetal intolerance to labor so presents for primary C/S.   Reproductive/Obstetrics (+) Pregnancy                             Anesthesia Physical Anesthesia Plan  ASA: 3  Anesthesia Plan: Spinal   Post-op Pain Management:    Induction:   PONV Risk Score and Plan: Treatment may vary due to age or medical condition  Airway Management Planned: Natural Airway  Additional Equipment:   Intra-op Plan:   Post-operative Plan:   Informed Consent: I have reviewed the patients History and Physical, chart, labs and discussed the procedure including the risks, benefits and alternatives for the proposed anesthesia with the patient or authorized representative who has indicated his/her understanding and acceptance.     Dental advisory given  Plan Discussed with: CRNA  Anesthesia Plan Comments:         Anesthesia Quick Evaluation

## 2021-01-01 NOTE — Op Note (Signed)
Amy Elliott PROCEDURE DATE: 01/01/2021  PREOPERATIVE DIAGNOSES: Intrauterine pregnancy at [redacted]w[redacted]d weeks gestation; non-reassuring fetal status  POSTOPERATIVE DIAGNOSES: The same  PROCEDURE: Primary Low Transverse Cesarean Section  SURGEON:   Lyndel Safe, MD  Allayne Stack, DO   ANESTHESIOLOGY TEAM: Anesthesiologist: Cecile Hearing, MD; Elmer Picker, MD CRNA: Renford Dills, CRNA  INDICATIONS: Amy Elliott is a 20 y.o. G1P1001 at [redacted]w[redacted]d here for an urgent cesarean section secondary to the indications listed under preoperative diagnoses; please see preoperative note for further details.  The risks of cesarean section were discussed with the patient including but were not limited to: bleeding which may require transfusion or reoperation; infection which may require antibiotics; injury to bowel, bladder, ureters or other surrounding organs; injury to the fetus; need for additional procedures including hysterectomy in the event of a life-threatening hemorrhage; placental abnormalities wth subsequent pregnancies, incisional problems, thromboembolic phenomenon and other postoperative/anesthesia complications.   The patient concurred with the proposed plan, giving informed written consent for the procedure.    FINDINGS:  Viable female infant in cephalic presentation, asynclitic. Nuchal cord x1.  APGAR (1 MIN): 8 APGAR (5 MINS): 9 Meconium stained amniotic fluid.  Intact placenta, three vessel cord.  Normal uterus, fallopian tubes and ovaries bilaterally.  ANESTHESIA: Spinal INTRAVENOUS FLUIDS: 1,800 ml   ESTIMATED BLOOD LOSS: 620 ml URINE OUTPUT:  125 ml SPECIMENS: Placenta sent to pathology COMPLICATIONS: None immediate  PROCEDURE IN DETAIL:  The patient preoperatively received intravenous antibiotics and had sequential compression devices applied to her lower extremities.  She was then taken to the operating room where spinal anesthesia was administered and was found to be  adequate. She was then placed in a dorsal supine position with a leftward tilt, and prepped and draped in a sterile manner.  A foley catheter was placed into her bladder and attached to constant gravity.  After an adequate timeout was performed, a Pfannenstiel skin incision was made with scalpel and carried through to the underlying layer of fascia. The fascia was incised in the midline, and this incision was extended bilaterally bluntly.  Kocher clamps were applied to the superior aspect of the fascial incision and the underlying rectus muscles were dissected off bluntly and sharply.  A similar process was carried out on the inferior aspect of the fascial incision. The rectus muscles were separated in the midline and the peritoneum was entered bluntly. The Alexis self-retaining retractor was introduced into the abdominal cavity. The vesicouterine peritoneum was identified and grasped using smooth pickups.  It was incised and extended laterally using the Metzenbaum scissors.  A bladder flap was then digitally created.  Attention was turned to the lower uterine segment where a low transverse hysterotomy was made with a scalpel and extended bilaterally bluntly.  The infant was successfully delivered, the cord was clamped and cut after one minute, and the infant was handed over to the awaiting neonatology team.   Uterine massage was then administered, and the placenta delivered intact with a three-vessel cord. The uterus was then cleared of clots and debris.  The hysterotomy was closed with 0 Vicryl in a running locked fashion, and an imbricating layer was also placed with 0 Vicryl. Few Figure-of-eight 0 Vicryl serosal stitches were placed to help with hemostasis.  The pelvis was cleared of all clot and debris. Hemostasis was confirmed on all surfaces.  The retractor was removed.  The peritoneum was closed with a 0 Vicryl running stitch. The fascia was then closed using 0 Vicryl  PDS in a running fashion.  The  subcutaneous layer was irrigated, re-approximated with 2-0 plain gut interrupted stitches, and the skin was closed with a 4-0 Vicryl subcuticular stitch. The patient tolerated the procedure well. Sponge, instrument and needle counts were correct x 3.  She was taken to the recovery room in stable condition.   Allayne Stack, DO [facility]   .bass

## 2021-01-01 NOTE — Progress Notes (Signed)
Patient ID: Amy Elliott, female   DOB: 11/09/2000, 20 y.o.   MRN: [id number]  Labor Progress Note Amy Elliott is a 20 y.o. G1P0000 at [redacted]w[redacted]d presented for IOL for new onset gHTN with normal labs and no symptoms  S:  Pt in bed, appears tired, tenses with contractions but stays quiet and breathes deeply. MGM at bedside for support, FOB also present.  O:  BP (!) 156/86    Pulse 68    Temp 98.2 F (36.8 C) (Axillary)    Resp 18    Ht 5\' 6"  (1.676 m)    Wt 285 lb 4.8 oz (129.4 kg)    LMP 03/23/2020    SpO2 100%    BMI 46.05 kg/m  EFM: baseline 145 bpm/ moderate variability/ 15x15 accels/ no decels  Toco/IUPC: q2-38min SVE: Dilation: 4.5 Effacement (%): 70 Cervical Position: Posterior Station: -2 Presentation: Vertex Exam by:: [email or web address], CNM Pitocin: 14 mu/min  A/P: 20 y.o. G1P0000 [redacted]w[redacted]d  1. Labor: Latent 2. FWB: Cat 1 3. Pain: Well tolerated with deep breathing and familial support, having a hard time being mobile and changing positions frequently due to fatigue. Has had several doses of fentanyl but resistant to trying an epidural. 4. gHTN: had a severe range pressure, recheck was elevated but not severe. Will continue to monitor and start magnesium titration if needed.  Continue to titrate pitocin, encouraged frequent position changes either on the birth ball or in the bed. Anticipate SVD.  [redacted]w[redacted]d, CNM, MSN, IBCLC Certified Nurse Midwife, [facility]

## 2021-01-01 NOTE — Progress Notes (Signed)
G1P0000 at  [redacted]w[redacted]d  induced for gHTN.   Called to room for prolonged fetal deceleration. Pitocin was at 69mu and was stopped. Patient moved to hands and knees with eventual resolution after 4-5 minutes.   CNM Edd Arbour at bedside as well. We reviewed the labor course with multiple attempts to restart pitocin. Patient has IUPC, FSE in place.  Patient was laboring unmedicated and tried multiple positions changes.   Her cervix remains 4-5cm and we are unable to start Pitocin with experiencing what are now prolonged fetal bradycardia episodes.   The risks of cesarean section were discussed with the patient including but were not limited to: bleeding which may require transfusion or reoperation; infection which may require antibiotics; injury to bowel, bladder, ureters or other surrounding organs; injury to the fetus; need for additional procedures including hysterectomy in the event of a life-threatening hemorrhage; placental abnormalities wth subsequent pregnancies, incisional problems, thromboembolic phenomenon and other postoperative/anesthesia complications.  The patient concurred with the proposed plan, giving informed written consent for the procedures.  She will remain NPO for procedure. Anesthesia and OR aware.  Preoperative prophylactic antibiotics and SCDs ordered on call to the OR.  To OR when ready.

## 2021-01-01 NOTE — Progress Notes (Signed)
Patient ID: Amy Elliott, female   DOB: 22-Aug-2000, 20 y.o.   MRN: [id number]  Feeling ctx stronger, breathing w them; Pit started 12h ago  BPs 143/77, 129/59 FHR 150-160s, +accels, occ variables Ctx q 2-4 mins with Pit at 7mu/min Cx 5/70/vtx -2; AROM for mod MSF  IUP@40 .4wks gHTN IOL process  Hopeful that with AROM and Pit she will begin active labor Anticipate vag del  Arabella Merles CNM 01/01/2021 2:20 AM

## 2021-01-01 NOTE — Progress Notes (Signed)
Patient ID: Amy Elliott, female   DOB: 31-Mar-2000, 20 y.o.   MRN: [id number]  In to tell pt and family goodbye- they appear to be resting  BP 138/71, P 76 FHR 140s, +accels, mi variables Ctx irreg 4-7 with interspersed UI with Pit just increased to 15mu/min Cx deferred as ctx are spaced out  IUP@40 .4wks gHTN IOL process  Plan to uptitrate Pit to achieve active labor and delivery  Arabella Merles CNM 01/01/2021

## 2021-01-01 NOTE — Discharge Summary (Signed)
Postpartum Discharge Summary      Patient Name: Amy Elliott DOB: [birth year] MRN: [id number]  Date of admission: 12/30/2020 Delivery date:01/01/2021  Delivering provider: Caren Macadam  Date of discharge: 01/03/2021  Admitting diagnosis: Gestational hypertension [O13.9] Intrauterine pregnancy: [redacted]w[redacted]d    Secondary diagnosis:  Principal Problem:   Gestational hypertension Active Problems:   Generalized anxiety disorder   Obesity in pregnancy, antepartum   GBS (group B Streptococcus carrier), +RV culture, currently pregnant  Additional problems: Non reassuring FHTracing    Discharge diagnosis: Term Pregnancy Delivered and Gestational Hypertension                                              Post partum procedures: IV Venofer Augmentation: AROM, Pitocin, Cytotec, and IP Foley Complications: Fetal intolerance to labor  Hospital course: Induction of Labor With Cesarean Section   20y.o. yo G1P0000 at 473w4das admitted to the hospital 12/30/2020 for induction of labor. Patient had a labor course significant for induction/augmentation with FB, cytotec, AROM, and Pit. Despite ROM for 15 hours, she made minimal change with fetal intolerance (prolonged decelerations) to continued labor. The patient went for cesarean section due to Non-Reassuring FHR. Delivery details are as follows: Membrane Rupture Time/Date: 2:00 AM ,01/01/2021   Delivery Method:  Details of operation can be found in separate operative Note.  Her blood pressure has been labile with several borderline readings.  She is started on both procardia and lasix and enrolled in the [employer] BP monitoring program.  Meds to [facility] not available today. She is ambulating, tolerating a regular diet, passing flatus, and urinating well.  Patient strongly desires discharge today, and is discharged home in stable condition on 01/03/21.      Newborn Data: Birth date:01/01/2021  Birth time:5:21 PM  Gender:Female  Living  status:Living  Apgars:8 ,9  Weight:3770 g                                Magnesium Sulfate received: No BMZ received: No Rhophylac:No MMR:No T-DaP:Given prenatally Flu: No Transfusion:No  Physical exam  Vitals:   01/02/21 1000 01/02/21 1640 01/02/21 1941 01/03/21 0602  BP: (!) 107/49 (!) 132/55 117/60 135/65  Pulse: 61 61 60 60  Resp: _0 Temp: 97.7 F (36.5 C) 97.7 F (36.5 C) 98.3 F (36.8 C) (!) 97.4 F (36.3 C)  TempSrc: Oral Oral Oral Oral  SpO2: 99% 100% 100% 100%  Weight:      Height:       General: alert, cooperative, and no distress Lochia: appropriate Uterine Fundus: firm Incision: Healing well with no significant drainage, No significant erythema, Dressing is clean, dry, and intact DVT Evaluation: No evidence of DVT seen on physical exam. Negative Homan's sign. No cords or calf tenderness. No significant calf/ankle edema. Labs: Lab Results  Component Value Date   WBC 6.8 01/02/2021   HGB 8.0 (L) 01/02/2021   HCT 23.5 (L) 01/02/2021   MCV 85.5 01/02/2021   PLT 183 01/02/2021   CMP Latest Ref Rng & Units 12/30/2020  Glucose 70 - 99 mg/dL 79  BUN 6 - 20 mg/dL 7  Creatinine 0.44 - 1.00 mg/dL 0.77  Sodium 135 - 145 mmol/L 135  Potassium 3.5 - 5.1 mmol/L 3.8  Chloride 98 -  111 mmol/L 109  CO2 22 - 32 mmol/L 21(L)  Calcium 8.9 - 10.3 mg/dL 8.6(L)  Total Protein 6.5 - 8.1 g/dL 6.0(L)  Total Bilirubin 0.3 - 1.2 mg/dL 0.4  Alkaline Phos 38 - 126 U/L 141(H)  AST 15 - 41 U/L 19  ALT 0 - 44 U/L 12   Edinburgh Score: No flowsheet data found.   After visit meds:  Allergies as of 01/03/2021       Reactions   Banana    Other Itching   Grapes with seeds        Medication List     STOP taking these medications    aspirin EC 81 MG tablet   Blood Pressure Kit Devi       TAKE these medications    acetaminophen 500 MG tablet Commonly known as: TYLENOL Take 1,000 mg by mouth every 6 (six) hours as needed for headache.    furosemide 20 MG tablet Commonly known as: LASIX Take 1 tablet (20 mg total) by mouth daily.   ibuprofen 600 MG tablet Commonly known as: ADVIL Take 1 tablet (600 mg total) by mouth every 6 (six) hours.   Iron Polysacch Cmplx-B12-FA 150-0.025-1 MG Caps Take 1 capsule by mouth every other day.   NIFEdipine 30 MG 24 hr tablet Commonly known as: ADALAT CC Take 1 tablet (30 mg total) by mouth 2 (two) times daily.   oxyCODONE 5 MG immediate release tablet Commonly known as: Oxy IR/ROXICODONE Take 1 tablet (5 mg total) by mouth every 6 (six) hours as needed for up to 5 days for moderate pain.   Slynd 4 MG Tabs Generic drug: Drospirenone Take 1 tablet by mouth daily. Start on the Sunday after the baby turns 53 weeks old   Vitafol Ultra 29-0.6-0.4-200 MG Caps Take 1 tablet by mouth daily.         Discharge home in stable condition Infant Feeding: Bottle and Breast Infant Disposition:home with mother Discharge instruction: per After Visit Summary and Postpartum booklet. Activity: Advance as tolerated. Pelvic rest for 6 weeks.  Diet: routine diet Future Appointments: Future Appointments  Date Time Provider [place]  01/06/2021  1:30 PM Chancy Milroy, MD [place] None  02/13/2021 10:15 AM Shelly Bombard, MD [place] None   Follow up Visit:  [place]. Go on 01/06/2021.   Specialty: Obstetrics and Gynecology Why: for blood pressure and incision check Contact information: [address] [place] [phone]                Message sent by Dr Higinio Plan to [facility]:   Please schedule this patient for a In person postpartum visit in 6 weeks with the following provider: Any provider. Additional Postpartum F/U:Incision check 1 week and BP check 1 week  High risk pregnancy complicated by: HTN Delivery mode: C-section  Anticipated Birth Control:   POPs   01/03/2021 Christin Fudge, CNM

## 2021-01-01 NOTE — Anesthesia Procedure Notes (Signed)
Spinal  Patient location during procedure: OR Start time: 01/01/2021 4:50 PM End time: 01/01/2021 4:53 PM Reason for block: surgical anesthesia Staffing Performed: anesthesiologist  Anesthesiologist: Elmer Picker, MD Preanesthetic Checklist Completed: patient identified, IV checked, risks and benefits discussed, surgical consent, monitors and equipment checked, pre-op evaluation and timeout performed Spinal Block Patient position: sitting Prep: DuraPrep and site prepped and draped Patient monitoring: cardiac monitor, continuous pulse ox and blood pressure Approach: midline Location: L3-4 Injection technique: single-shot Needle Needle type: Pencan  Needle gauge: 24 G Needle length: 9 cm Assessment Sensory level: T6 Events: CSF return Additional Notes Functioning IV was confirmed and monitors were applied. Sterile prep and drape, including hand hygiene and sterile gloves were used. The patient was positioned and the spine was prepped. The skin was anesthetized with lidocaine.  Free flow of clear CSF was obtained prior to injecting local anesthetic into the CSF.  The spinal needle aspirated freely following injection.  The needle was carefully withdrawn.  The patient tolerated the procedure well.

## 2021-01-01 NOTE — Progress Notes (Signed)
Patient ID: Amy Elliott, female   DOB: 07-24-2000, 20 y.o.   MRN: [id number]  Labor Progress Note Amy Elliott is a 20 y.o. G1P0000 at [redacted]w[redacted]d presented for IOL for new onset gHTN with normal labs and no symptoms  S:  Called to bedside by RN, baby had a swift, deep decel when mom switched to her right side in the bed. Pt tolerated exam and rapid position changes well. Is tired but willing to continue trying position changes, still resistant to epidural use.  O:  BP (!) 156/86    Pulse 68    Temp 98.2 F (36.8 C) (Axillary)    Resp 18    Ht 5\' 6"  (1.676 m)    Wt 285 lb 4.8 oz (129.4 kg)    LMP 03/23/2020    SpO2 100%    BMI 46.05 kg/m  EFM: baseline 145 bpm/ moderate variability/ 15x15 accels/ no decels  Toco/IUPC: q2-51min SVE: Dilation: 4.5 Effacement (%): 70 Cervical Position: Posterior Station: -2 Presentation: Vertex Exam by:: [email or web address], CNM Pitocin: turned off during decel  A/P: 20 y.o. G1P0000 [redacted]w[redacted]d  1. Labor: Latent 2. FWB: Cat 2 but recovered to cat 1 and tolerating hands/knees well 3. Pain: Tensing with contractions, requests another dose of fentanyl but still resistant to epidural use 4. gHTN: BPs elevated but not severe range. Will continue to monitor and start magnesium titration if needed.  Pitocin titration stopped, will restart at 2pm. Anticipate SVD.  [redacted]w[redacted]d, CNM, MSN, IBCLC Certified Nurse Midwife, [facility]

## 2021-01-02 LAB — CBC
HCT: 23.5 % — ABNORMAL LOW (ref 36.0–46.0)
Hemoglobin: 8 g/dL — ABNORMAL LOW (ref 12.0–15.0)
MCH: 29.1 pg (ref 26.0–34.0)
MCHC: 34 g/dL (ref 30.0–36.0)
MCV: 85.5 fL (ref 80.0–100.0)
Platelets: 183 10*3/uL (ref 150–400)
RBC: 2.75 MIL/uL — ABNORMAL LOW (ref 3.87–5.11)
RDW: 12.9 % (ref 11.5–15.5)
WBC: 6.8 10*3/uL (ref 4.0–10.5)
nRBC: 0 % (ref 0.0–0.2)

## 2021-01-02 MED ORDER — DIPHENHYDRAMINE HCL 50 MG/ML IJ SOLN
25.0000 mg | Freq: Once | INTRAMUSCULAR | Status: AC
  Administered 2021-01-02: 01:00:00 25 mg via INTRAVENOUS
  Filled 2021-01-02: qty 1

## 2021-01-02 MED ORDER — SODIUM CHLORIDE 0.9 % IV SOLN
500.0000 mg | Freq: Once | INTRAVENOUS | Status: AC
  Administered 2021-01-02: 16:00:00 500 mg via INTRAVENOUS
  Filled 2021-01-02: qty 25

## 2021-01-02 MED ORDER — LACTATED RINGERS IV BOLUS
500.0000 mL | Freq: Once | INTRAVENOUS | Status: AC
  Administered 2021-01-02: 01:00:00 500 mL via INTRAVENOUS

## 2021-01-02 NOTE — Progress Notes (Signed)
POSTPARTUM PROGRESS NOTE  POD #1  Subjective:  Amy Elliott is a 20 y.o. G1P1001 s/p pLTCS at [redacted]w[redacted]d for NRFHT. No acute events overnight. She reports she is doing well. She denies any problems with ambulating, voiding or po intake. Denies nausea or vomiting. She has passed flatus. Pain is well controlled.  Lochia is mild. Denies any lightheadedness/dizziness, SOB, CP.   Objective: Blood pressure (!) 107/49, pulse 61, temperature 97.7 F (36.5 C), temperature source Oral, resp. rate 16, height 5\' 6"  (1.676 m), weight 129.4 kg, last menstrual period 03/23/2020, SpO2 99 %, unknown if currently breastfeeding.  Physical Exam:  General: alert, cooperative and no distress Chest: no respiratory distress Heart: regular rate, distal pulses intact Uterine Fundus: firm, appropriately tender DVT Evaluation: No calf swelling or tenderness Extremities: pedal edema bilaterally  Skin: warm, dry; incision with prevena, suction is active and in place.   Recent Labs    12/30/20 1330 01/02/21 0455  HGB 10.4* 8.0*  HCT 30.8* 23.5*    Assessment/Plan: Amy Elliott is a 20 y.o. G1P1001 s/p pLTCS at [redacted]w[redacted]d for NRFHTs.  POD#1 - Doing welll; pain controlled.   Routine postpartum care  OOB, ambulated  Lovenox for VTE prophylaxis Acute blood loss Anemia: asymptomatic  IV venofer today, ferrous sulfate at home    Contraception: POPs Feeding: Breast  Dispo: Plan for discharge tomorrow or following day.   LOS: 3 days   [redacted]w[redacted]d, DO OB Fellow  01/02/2021, 10:16 AM

## 2021-01-02 NOTE — Lactation Note (Signed)
This note was copied from a baby's chart. Lactation Consultation Note  Patient Name: Amy Elliott Today's Date: 01/02/2021   Age:21 hours Term female infant, -1% weight loss, mom requested LC services. Per mom, infant is breastfeeding 40 to 50 minutes most feedings and feeding more frequently, infant recently breastfeed for over 40 minutes, prior to [facility] entering the room, dad was holding infant and infant appeared content. LC reminded parents this is normal infant behavior infant is currently cluster feeding. LC reminded mom to sleep when infant is sleeping for maternal rest and dad is helping mom with doing skin to skin with infant so mom can rest. Per mom, nipples are little sore, no abrasion were note and RN will give mom some coconut oil. Maternal Data    Feeding    LATCH Score                    Lactation Tools Discussed/Used    Interventions    Discharge    Consult Status      Amy Elliott 01/02/2021, 11:57 PM

## 2021-01-02 NOTE — Lactation Note (Signed)
This note was copied from a baby's chart. Lactation Consultation Note  Patient Name: Amy Elliott [id number] Date: 01/02/2021 Reason for consult: Follow-up assessment;Primapara;Term;1st time breastfeeding Age:21 hours   Lactation Follow Up Consult:  Mother had baby "[name]" latched to the right breast in the football hold when I arrived.  She appeared to be latched well, however, she was very sleepy.  Suggested mother remove her clothing and awaken her.  Provided pillow support and assisted to latch again.  "[name]" more awake and actively sucking.  Observed her feeding for 10 minutes while reviewing breast feeding basics.  Encouraged continued hand expression and feeding back any EBM she obtains to baby.  Suggested she call her RN/LC as needed for latch assistance.  Father present.   Maternal Data Has patient been taught Hand Expression?: Yes Does the patient have breastfeeding experience prior to this delivery?: No  Feeding Mother's Current Feeding Choice: Breast Milk  LATCH Score Latch: Repeated attempts needed to sustain latch, nipple held in mouth throughout feeding, stimulation needed to elicit sucking reflex.  Audible Swallowing: None  Type of Nipple: Everted at rest and after stimulation  Comfort (Breast/Nipple): Soft / non-tender  Hold (Positioning): Assistance needed to correctly position infant at breast and maintain latch.  LATCH Score: 6   Lactation Tools Discussed/Used    Interventions Interventions: Breast feeding basics reviewed;Assisted with latch;Skin to skin;Breast massage;Hand express;Breast compression;Position options;Support pillows;Adjust position;Education  Discharge Pump: Personal  Consult Status Consult Status: Follow-up Date: 01/03/21 Follow-up type: In-patient    Ezana Hubbert R Lasandra Batley 01/02/2021, 9:20 AM

## 2021-01-02 NOTE — Progress Notes (Signed)
CSW received consult for hx of Anxiety and Depression.  CSW met with MOB to offer support and complete assessment, MOB was accompanied by 3 visitors. MOB granted CSW verbal permission to speak in front of visitors about anything. CSW introduced self and explained role. MOB was welcoming, pleasant, and remained engaged during assessment. CSW and MOB discussed MOB's mental health history. MOB reported that she was diagnosed with anxiety and depression in 2019. MOB reported that she was having some symptoms at the beginning of pregnancy that eventually went away. MOB reported that she is not taking any medication nor participating in therapy to treat mental health diagnoses. MOB denied any additional mental health history. CSW inquired about how MOB was feeling emotionally since giving birth, MOB reported that she is feeling good and happy. CSW inquired about MOB's support system, MOB reported that her boyfriend and mom are supports. MOB reported that they have all items needed to care for infant including a car seat and basinet. MOB presented calm and did not demonstrate any acute mental health signs/symptoms. CSW assessed for safety, MOB denied SI and HI. CSW did not assess for domestic violence as FOB was present.     CSW provided education regarding the baby blues period vs. perinatal mood disorders, discussed treatment and gave resources for mental health follow up if concerns arise.  CSW recommends self-evaluation during the postpartum time period using the New Mom Checklist from Postpartum Progress and encouraged MOB to contact a medical professional if symptoms are noted at any time.    CSW provided review of Sudden Infant Death Syndrome (SIDS) precautions.    CSW identifies no further need for intervention and no barriers to discharge at this time.  Abundio Miu, [place] Worker [facility] Cell#: [phone]

## 2021-01-03 MED ORDER — SLYND 4 MG PO TABS: 1.0000 | ORAL_TABLET | Freq: Every day | ORAL | 11 refills | Status: DC

## 2021-01-03 MED ORDER — NIFEDIPINE ER 30 MG PO TB24: 30.0000 mg | ORAL_TABLET | Freq: Two times a day (BID) | ORAL | 0 refills | Status: DC

## 2021-01-03 MED ORDER — IBUPROFEN 600 MG PO TABS: 600.0000 mg | ORAL_TABLET | Freq: Four times a day (QID) | ORAL | 0 refills | Status: DC

## 2021-01-03 MED ORDER — NIFEDIPINE ER OSMOTIC RELEASE 30 MG PO TB24
30.0000 mg | ORAL_TABLET | Freq: Two times a day (BID) | ORAL | Status: DC
  Administered 2021-01-03 – 2021-01-04 (×3): 30 mg via ORAL
  Filled 2021-01-03 (×3): qty 1

## 2021-01-03 MED ORDER — FUROSEMIDE 20 MG PO TABS
20.0000 mg | ORAL_TABLET | Freq: Every day | ORAL | Status: DC
  Administered 2021-01-03 – 2021-01-04 (×2): 20 mg via ORAL
  Filled 2021-01-03 (×2): qty 1

## 2021-01-03 MED ORDER — FUROSEMIDE 20 MG PO TABS: 20.0000 mg | ORAL_TABLET | Freq: Every day | ORAL | 0 refills | Status: DC

## 2021-01-03 MED ORDER — OXYCODONE HCL 5 MG PO TABS: 5.0000 mg | ORAL_TABLET | Freq: Four times a day (QID) | ORAL | 0 refills | Status: DC | PRN

## 2021-01-03 NOTE — Progress Notes (Signed)
Baby not discharging so patient wants to stay as a couplet. Discharge discontinued per verbal order from Dr. Mathis Fare. Royston Cowper, RN

## 2021-01-03 NOTE — Lactation Note (Signed)
This note was copied from a baby's chart. Lactation Consultation Note  Patient Name: Amy Elliott [id number] Date: 01/03/2021   Age:21 hours  Mom's L nipple is smaller than R nipple. Staff had provided Mom with a size 30 flange, which seems appropriate for R breast at this moment. Mom was sized with a size 21 flange for L breast. Mom commented that the areola on her R breast is swollen. During the course of my consult, the swelling seemed to decrease. I will re-evaluate to see if a size 30 flange is still appropriate.  I noted that infant had comfortable tachypnea when at breast and bottle (it did not interfere with sucking & swallowing). I noted this continued at rest. I let K. Ernest Mallick, RN know and she came in to evaluate. I also shared with RN that infant's legs seemed jittery when upset.  The standard Nfant flow nipple was used for the bottle feeding while I was in the room. However, I feel that infant would feed more comfortably with the purple Nfant nipple, which I took into the room. RN was also made aware of this.   Lurline Hare [facility] 01/03/2021, 8:19 AM

## 2021-01-03 NOTE — Progress Notes (Addendum)
Subjective: POD# 2 Live born female  Birth Weight: 8 lb 5 oz (3770 g) APGAR: 8, 9  Newborn Delivery   Birth date/time: 01/01/2021 17:21:00 Delivery type:      Baby name: [facility] Delivering provider: Lauretta Chester NILES   Feeding: Bottle  Pain control at delivery: Spinal   Reports feeling well just tired. No complaints at this time Reports passing flatus and no pain with BM Patient reports tolerating PO.   Pain controlled with acetaminophen and ibuprofen (OTC) Denies HA/SOB/C/P/N/V/dizziness. She reports vaginal bleeding as normal, without clots. She is ambulating and urinating without difficulty.     Objective:   Vitals:   01/02/21 1000 01/02/21 1640 01/02/21 1941 01/03/21 0602  BP: (!) 107/49 (!) 132/55 117/60 135/65  Pulse: 61 61 60 60  Resp: $Remo'16 16 18 17  'YScII$ Temp: 97.7 F (36.5 C) 97.7 F (36.5 C) 98.3 F (36.8 C) (!) 97.4 F (36.3 C)  TempSrc: Oral Oral Oral Oral  SpO2: 99% 100% 100% 100%  Weight:      Height:         Intake/Output Summary (Last 24 hours) at 01/03/2021 3729 Last data filed at 01/03/2021 0211 Gross per 24 hour  Intake 1725 ml  Output 1285 ml  Net 440 ml        Recent Labs    01/02/21 0455  WBC 6.8  HGB 8.0*  HCT 23.5*  PLT 183     Blood type: --/--/A POS (12/20 1330)  Rubella: 1.68 (06/09 1608)   Vaccines:   TDaP          Declined                     COVID-19  Declined          Flu      Declined  Physical Exam:  General: alert, cooperative, and appears older than stated age CV: Regular rate and rhythm Resp: clear Abdomen: soft, nontender, normal bowel sounds Incision: clean, dry, and intact, wound vac  Uterine Fundus: firm, below umbilicus, nontender Lochia: minimal Ext: extremities normal, atraumatic, no cyanosis or edema and no edema, redness or tenderness in the calves or thighs  Assessment/Plan: 20 y.o.   POD# 2. G1P1001                  Principal Problem:   Gestational hypertension Active Problems:    Generalized anxiety disorder   Obesity in pregnancy, antepartum   GBS (group B Streptococcus carrier), +RV culture, currently pregnant             Encourage rest when baby rests Breastfeeding support Encourage to ambulate Routine post-op care   Trenton Gammon 01/03/2021, 6:58 AM  I personally saw and evaluated the patient, performing the key elements of the service. I developed and verified the management plan that is described in the resident's/student's note, and I agree with the content with my edits above. VSS, HRR&R, Resp unlabored, Legs neg.  Nigel Berthold, CNM 01/05/2021 9:35 PM

## 2021-01-04 MED ORDER — SLYND 4 MG PO TABS: 1.0000 | ORAL_TABLET | Freq: Every day | ORAL | 0 refills | Status: DC

## 2021-01-04 MED ORDER — OXYCODONE HCL 5 MG PO TABS: 5.0000 mg | ORAL_TABLET | Freq: Four times a day (QID) | ORAL | 0 refills | Status: AC | PRN

## 2021-01-04 MED ORDER — IBUPROFEN 600 MG PO TABS: 600.0000 mg | ORAL_TABLET | Freq: Four times a day (QID) | ORAL | 0 refills | Status: DC

## 2021-01-04 MED ORDER — FUROSEMIDE 20 MG PO TABS: 20.0000 mg | ORAL_TABLET | Freq: Every day | ORAL | 0 refills | Status: DC

## 2021-01-04 MED ORDER — FERROUS SULFATE 325 (65 FE) MG PO TABS: 325.0000 mg | ORAL_TABLET | ORAL | 0 refills | Status: DC

## 2021-01-04 MED ORDER — ACETAMINOPHEN 500 MG PO TABS: 1000.0000 mg | ORAL_TABLET | Freq: Four times a day (QID) | ORAL | 0 refills | Status: DC

## 2021-01-04 MED ORDER — NIFEDIPINE ER 30 MG PO TB24: 30.0000 mg | ORAL_TABLET | Freq: Two times a day (BID) | ORAL | 0 refills | Status: DC

## 2021-01-04 NOTE — Discharge Summary (Signed)
Postpartum Discharge Summary      Patient Name: Amy Elliott DOB: 02/06/2000 MRN: [id number]  Date of admission: 12/30/2020 Delivery date:01/01/2021  Delivering provider: Caren Macadam  Date of discharge: 01/04/2021  Admitting diagnosis: Gestational hypertension [O13.9] Intrauterine pregnancy: [redacted]w[redacted]d     Secondary diagnosis:  Principal Problem:   Gestational hypertension Active Problems:   Generalized anxiety disorder   Encounter for supervision of normal first pregnancy in third trimester   Obesity in pregnancy, antepartum   GBS (group B Streptococcus carrier), +RV culture, currently pregnant  Additional problems: Non reassuring FHTracing    Discharge diagnosis: Term Pregnancy Delivered and Gestational Hypertension                                              Post partum procedures: IV Venofer Augmentation: AROM, Pitocin, Cytotec, and IP Foley Complications: Fetal intolerance to labor  Hospital course: Induction of Labor With Cesarean Section   20 y.o. yo G1P0000 at [redacted]w[redacted]d was admitted to the hospital 12/30/2020 for induction of labor. Patient had a labor course significant for induction/augmentation with FB, cytotec, AROM, and Pit. Despite ROM for 15 hours, she made minimal change with fetal intolerance (prolonged decelerations) to continued labor. The patient went for cesarean section due to Non-Reassuring FHR. Delivery details are as follows: Membrane Rupture Time/Date: 2:00 AM ,01/01/2021   Delivery Method:  Details of operation can be found in separate operative Note.  Her blood pressure has been labile with several borderline readings.  She is started on both procardia and lasix and enrolled in the Babyscripts BP monitoring program.  Meds to [facility] is not available today. She is ambulating, tolerating a regular diet, passing flatus, and urinating well.  Patient  is discharged home in stable condition on 01/04/21.      Newborn Data: Birth date:01/01/2021  Birth  time:5:21 PM  Gender:Female  Living status:Living  Apgars:8 ,9  Weight:3770 g                                Magnesium Sulfate received: No BMZ received: No Rhophylac:No MMR:No T-DaP:Given prenatally Flu: No Transfusion:No  Physical exam  Vitals:   01/03/21 0745 01/03/21 1745 01/03/21 2123 01/04/21 0525  BP: 123/73 122/72 134/65 (!) 141/75  Pulse:  64 78 93  Resp:  $Remo'18 19 18  'brCfn$ Temp:  98.3 F (36.8 C) 97.9 F (36.6 C) 97.6 F (36.4 C)  TempSrc:  Oral  Oral  SpO2:  99%    Weight:      Height:       General: alert, cooperative, and no distress Lochia: appropriate Uterine Fundus: firm Incision: Healing well with no significant drainage, No significant erythema, Dressing is clean, dry, and intact DVT Evaluation: No evidence of DVT seen on physical exam. Negative Homan's sign. No cords or calf tenderness. No significant calf/ankle edema. Labs: Lab Results  Component Value Date   WBC 6.8 01/02/2021   HGB 8.0 (L) 01/02/2021   HCT 23.5 (L) 01/02/2021   MCV 85.5 01/02/2021   PLT 183 01/02/2021   CMP Latest Ref Rng & Units 12/30/2020  Glucose 70 - 99 mg/dL 79  BUN 6 - 20 mg/dL 7  Creatinine 0.44 - 1.00 mg/dL 0.77  Sodium 135 - 145 mmol/L 135  Potassium 3.5 - 5.1 mmol/L 3.8  Chloride 98 - 111 mmol/L 109  CO2 22 - 32 mmol/L 21(L)  Calcium 8.9 - 10.3 mg/dL 8.6(L)  Total Protein 6.5 - 8.1 g/dL 6.0(L)  Total Bilirubin 0.3 - 1.2 mg/dL 0.4  Alkaline Phos 38 - 126 U/L 141(H)  AST 15 - 41 U/L 19  ALT 0 - 44 U/L 12   Edinburgh Score: Edinburgh Postnatal Depression Scale Screening Tool 01/03/2021  I have been able to laugh and see the funny side of things. 0  I have looked forward with enjoyment to things. 0  I have blamed myself unnecessarily when things went wrong. 1  I have been anxious or worried for no good reason. 2  I have felt scared or panicky for no good reason. 1  Things have been getting on top of me. 1  I have been so unhappy that I have had difficulty  sleeping. 0  I have felt sad or miserable. 0  I have been so unhappy that I have been crying. 1  The thought of harming myself has occurred to me. 0  Edinburgh Postnatal Depression Scale Total 6     After visit meds:  Allergies as of 01/04/2021       Reactions   Banana    Other Itching   Grapes with seeds        Medication List     STOP taking these medications    aspirin EC 81 MG tablet   Blood Pressure Kit Devi       TAKE these medications    acetaminophen 500 MG tablet Commonly known as: TYLENOL Take 1,000 mg by mouth every 6 (six) hours as needed for headache. What changed: Another medication with the same name was added. Make sure you understand how and when to take each.   acetaminophen 500 MG tablet Commonly known as: TYLENOL Take 2 tablets (1,000 mg total) by mouth every 6 (six) hours. What changed: You were already taking a medication with the same name, and this prescription was added. Make sure you understand how and when to take each.   ferrous sulfate 325 (65 FE) MG tablet Take 1 tablet (325 mg total) by mouth every other day.   furosemide 20 MG tablet Commonly known as: LASIX Take 1 tablet (20 mg total) by mouth daily.   ibuprofen 600 MG tablet Commonly known as: ADVIL Take 1 tablet (600 mg total) by mouth every 6 (six) hours.   Iron Polysacch Cmplx-B12-FA 150-0.025-1 MG Caps Take 1 capsule by mouth every other day.   NIFEdipine 30 MG 24 hr tablet Commonly known as: ADALAT CC Take 1 tablet (30 mg total) by mouth 2 (two) times daily.   oxyCODONE 5 MG immediate release tablet Commonly known as: Oxy IR/ROXICODONE Take 1 tablet (5 mg total) by mouth every 6 (six) hours as needed for up to 5 days for moderate pain.   Slynd 4 MG Tabs Generic drug: Drospirenone Take 1 tablet by mouth daily. Start on the $Remo'Sunday after the baby turns 3 weeks old   Vitafol Ultra 29-0.6-0.4-200 MG Caps Take 1 tablet by mouth daily.                Discharge Care Instructions  (From admission, onward)           Start     Ordered   01/04/21 0000  Leave dressing on - Keep it clean, dry, and intact until clinic visit        12'ynkbE$ /25/22 1019   01/04/21  0000  Leave dressing on - Keep it clean, dry, and intact until clinic visit        01/04/21 1055             Discharge home in stable condition Infant Feeding: Bottle and Breast Infant Disposition:home with mother Discharge instruction: per After Visit Summary and Postpartum booklet. Activity: Advance as tolerated. Pelvic rest for 6 weeks.  Diet: routine diet Future Appointments: Future Appointments  Date Time Provider [place]  01/06/2021  1:30 PM Chancy Milroy, MD [place] None  02/13/2021 10:15 AM Shelly Bombard, MD [place] None   Follow up Visit:  [place]. Go on 01/06/2021.   Specialty: Obstetrics and Gynecology Why: for blood pressure and incision check Contact information: [address] [place] [phone]                Message sent by Dr Higinio Plan to [facility]:   Please schedule this patient for a In person postpartum visit in 6 weeks with the following provider: Any provider. Additional Postpartum F/U:Incision check 1 week and BP check 1 week  High risk pregnancy complicated by: HTN Delivery mode: C-section  Anticipated Birth Control:  POPs  Renard Matter, MD, MPH OB Fellow, Faculty Practice

## 2021-01-06 ENCOUNTER — Encounter: Payer: Self-pay | Admitting: Obstetrics and Gynecology

## 2021-01-06 ENCOUNTER — Other Ambulatory Visit: Payer: Self-pay

## 2021-01-06 ENCOUNTER — Ambulatory Visit (INDEPENDENT_AMBULATORY_CARE_PROVIDER_SITE_OTHER): Payer: Medicaid Other | Admitting: Obstetrics and Gynecology

## 2021-01-06 VITALS — BP 159/87 | HR 65 | Ht 66.0 in | Wt 301.0 lb

## 2021-01-06 DIAGNOSIS — O135 Gestational [pregnancy-induced] hypertension without significant proteinuria, complicating the puerperium: Secondary | ICD-10-CM

## 2021-01-06 DIAGNOSIS — O139 Gestational [pregnancy-induced] hypertension without significant proteinuria, unspecified trimester: Secondary | ICD-10-CM

## 2021-01-06 LAB — SURGICAL PATHOLOGY

## 2021-01-06 MED ORDER — TRIAMTERENE-HCTZ 37.5-25 MG PO TABS: 1.0000 | ORAL_TABLET | Freq: Two times a day (BID) | ORAL | 0 refills | Status: DC

## 2021-01-06 NOTE — Progress Notes (Signed)
Pt took furosemide around midnight and took her last dose of nifedipine around 9am this morning.

## 2021-01-06 NOTE — Patient Instructions (Signed)
Cesarean Delivery, Care After °The following information offers guidance on how to care for yourself after your procedure. Your health care provider may also give you more specific instructions. If you have problems or questions, contact your health care provider. °What can I expect after the procedure? °After the procedure, it is common to have: °A small amount of blood or clear fluid coming from the incision. °Some redness, swelling, and pain in your incision area. °Some abdominal pain and soreness. °Vaginal bleeding (lochia). Even though you did not have a vaginal delivery, you will still have vaginal bleeding and discharge. °Pelvic cramps. °Fatigue. °You may have pain, swelling, and discomfort in the tissue between your vagina and your anus (perineum) if: °Your C-section was unplanned, and you were allowed to labor and push. °An incision was made in the area (episiotomy) or the tissue tore during attempted vaginal delivery. °Follow these instructions at home: °Medicines °Take over-the-counter and prescription medicines only as told by your health care provider. °If you were prescribed an antibiotic medicine, take it as told by your health care provider. Do not stop taking the antibiotic even if you start to feel better. °Ask your health care provider if the medicine prescribed to you requires you to avoid driving or using machinery. °Incision care ° °Follow instructions from your health care provider about how to take care of your incision. Make sure you: °Wash your hands with soap and water for an least 20 seconds before and after you change your bandage (dressing). If soap and water are not available, use hand sanitizer. °If you have a dressing, change it or remove it as told by your health care provider. °Leave stitches (sutures), skin staples, skin glue, or adhesive strips in place. These skin closures may need to stay in place for 2 weeks or longer. If adhesive strip edges start to loosen and curl up, you  may trim the loose edges. Do not remove adhesive strips completely unless your health care provider tells you to do that. °Check your incision area every day for signs of infection. Check for: °More redness, swelling, or pain. °More fluid or blood. °Warmth. °Pus or a bad smell. °Do not take baths, swim, or use a hot tub until your health care provider approves. Ask your health care provider if you may take showers. °When you cough or sneeze, hug a pillow. This helps with pain and decreases the chance of your incision opening up (dehiscing). Do this until your incision heals. °Managing constipation °Your procedure may cause constipation. To prevent or treat constipation, you may need to: °Drink enough fluid to keep your urine pale yellow. °Take over-the-counter or prescription medicines. °Eat foods that are high in fiber, such as beans, whole grains, and fresh fruits and vegetables. °Limit foods that are high in fat and processed sugars, such as fried or sweet foods. °Activity ° °If possible, have someone help you care for your baby and help with household activities for at least a few days after you leave the hospital. °Rest as much as possible. Try to rest or take a nap while your baby is sleeping. °You may have to avoid lifting. Ask your health care provider how much you can safely lift. °Return to your normal activities as told by your health care provider. Ask your health care provider what activities are safe for you. °Talk with your health care provider about when you can engage in sexual activity. This may depend on your: °Risk of infection. °How fast you heal. °Comfort   and desire to engage in sexual activity. °Lifestyle °Do not drink alcohol. This is especially important if you are breastfeeding or taking pain medicine. °Do not use any products that contain nicotine or tobacco. These products include cigarettes, chewing tobacco, and vaping devices, such as e-cigarettes. If you need help quitting, ask your  health care provider. °General instructions °Do not use tampons or douches until your health care provider approves. °Wear loose, comfortable clothing and a supportive and well-fitting bra. °If you pass a blood clot, save it and call your health care provider to discuss. Do not flush blood clots down the toilet before you get instructions from your health care provider. °Keep all follow-up visits for you and your baby. This is important. °Contact a health care provider if: °You have: °A fever. °Dizziness or light-headedness. °Bad-smelling vaginal discharge. °A blood clot pass from your vagina. °Pus, blood, or a bad smell coming from your incision. °An incision that feels warm to the touch. °More redness, swelling, or pain around your incision. °Difficulty or pain when urinating. °Nausea or vomiting. °Little or no interest in activities you used to enjoy. °Your breasts turn red or become painful or hard. °You feel unusually sad or worried. °You have questions about caring for yourself or your baby. °You have redness, swelling, and pain in an arm or leg. °Get help right away if: °You have: °Pain that does not go away or get better with medicine. °Chest pain. °Trouble breathing. °Blurred vision, spots, or flashing lights in your vision. °Thoughts about hurting yourself or your baby. °New pain in your abdomen or in one of your legs. °A severe headache that does not get better with pain medicine. °You faint. °You bleed from your vagina so much that you fill more than one sanitary pad in one hour. Bleeding should not be heavier than your heaviest period. °These symptoms may be an emergency. Get help right away. Call 911. °Do not wait to see if the symptoms will go away. °Do not drive yourself to the hospital. °Get help right away if you feel like you may hurt yourself or others, or have thoughts about taking your own life. Go to your nearest emergency room or: °Call 911. °Call the National Suicide Prevention Lifeline at  1-800-273-8255 or 988. This is open 24 hours a day. °Text the Crisis Text Line at 741741. °Summary °After the procedure, it is common to have pain at your incision site, abdominal cramping, and slight bleeding from your vagina. °Check your incision area every day for signs of infection. °Tell your health care provider about any unusual symptoms. °Keep all follow-up visits for you and your baby. This is important. °This information is not intended to replace advice given to you by your health care provider. Make sure you discuss any questions you have with your health care provider. °Document Revised: 07/30/2020 Document Reviewed: 07/30/2020 °Elsevier Patient Education © 2022 [employer] Inc. ° °

## 2021-01-06 NOTE — Progress Notes (Signed)
Patient ID: Amy Elliott, female   DOB: 12-14-2000, 20 y.o.   MRN: [id number] [name] presents for BP and incision check S/P LTCS on 01/01/21 Discharged home on Procardia and Lasix Reports taking medications Denies HA or visual changes No bowel or bladder dysfunction  PE AF  BP as recorded  Lungs clear Heart RRR Abd soft ,+ BS, Provena removed, incision healing well  A/P S/P LTCS         GHTN  Wound care reviewed. Continue with Procardia. Start Maxzide 25 mg po bid x 7 days BP check in 1 week

## 2021-01-07 ENCOUNTER — Encounter (HOSPITAL_COMMUNITY): Payer: Self-pay | Admitting: Family Medicine

## 2021-01-09 ENCOUNTER — Encounter: Payer: Self-pay | Admitting: Obstetrics and Gynecology

## 2021-01-10 ENCOUNTER — Other Ambulatory Visit: Payer: Self-pay

## 2021-01-10 ENCOUNTER — Encounter (HOSPITAL_COMMUNITY): Payer: Self-pay | Admitting: Emergency Medicine

## 2021-01-10 ENCOUNTER — Inpatient Hospital Stay (HOSPITAL_COMMUNITY)
Admission: AD | Admit: 2021-01-10 | Discharge: 2021-01-13 | DRG: 776 | Disposition: A | Discharge status: Home / Self Care | Payer: Medicaid Other | Attending: Obstetrics and Gynecology | Admitting: Obstetrics and Gynecology

## 2021-01-10 DIAGNOSIS — O152 Eclampsia in the puerperium: Secondary | ICD-10-CM | POA: Diagnosis not present

## 2021-01-10 DIAGNOSIS — O904 Postpartum acute kidney failure: Secondary | ICD-10-CM | POA: Diagnosis not present

## 2021-01-10 DIAGNOSIS — Z20822 Contact with and (suspected) exposure to covid-19: Secondary | ICD-10-CM | POA: Diagnosis present

## 2021-01-10 DIAGNOSIS — O1495 Unspecified pre-eclampsia, complicating the puerperium: Secondary | ICD-10-CM | POA: Diagnosis present

## 2021-01-10 DIAGNOSIS — R519 Headache, unspecified: Secondary | ICD-10-CM

## 2021-01-10 DIAGNOSIS — N179 Acute kidney failure, unspecified: Secondary | ICD-10-CM | POA: Diagnosis present

## 2021-01-10 DIAGNOSIS — R001 Bradycardia, unspecified: Secondary | ICD-10-CM | POA: Diagnosis not present

## 2021-01-10 LAB — CBC
HCT: 25.5 % — ABNORMAL LOW (ref 36.0–46.0)
Hemoglobin: 8.4 g/dL — ABNORMAL LOW (ref 12.0–15.0)
MCH: 28.2 pg (ref 26.0–34.0)
MCHC: 32.9 g/dL (ref 30.0–36.0)
MCV: 85.6 fL (ref 80.0–100.0)
Platelets: 329 10*3/uL (ref 150–400)
RBC: 2.98 MIL/uL — ABNORMAL LOW (ref 3.87–5.11)
RDW: 13 % (ref 11.5–15.5)
WBC: 6.5 10*3/uL (ref 4.0–10.5)
nRBC: 0 % (ref 0.0–0.2)

## 2021-01-10 LAB — COMPREHENSIVE METABOLIC PANEL
ALT: 17 U/L (ref 0–44)
AST: 17 U/L (ref 15–41)
Albumin: 2.6 g/dL — ABNORMAL LOW (ref 3.5–5.0)
Alkaline Phosphatase: 119 U/L (ref 38–126)
Anion gap: 13 (ref 5–15)
BUN: 51 mg/dL — ABNORMAL HIGH (ref 6–20)
CO2: 16 mmol/L — ABNORMAL LOW (ref 22–32)
Calcium: 8.7 mg/dL — ABNORMAL LOW (ref 8.9–10.3)
Chloride: 110 mmol/L (ref 98–111)
Creatinine, Ser: 10.24 mg/dL — ABNORMAL HIGH (ref 0.44–1.00)
GFR, Estimated: 5 mL/min — ABNORMAL LOW (ref >60)
Glucose, Bld: 100 mg/dL — ABNORMAL HIGH (ref 70–99)
Potassium: 4.5 mmol/L (ref 3.5–5.1)
Sodium: 139 mmol/L (ref 135–145)
Total Bilirubin: 0.6 mg/dL (ref 0.3–1.2)
Total Protein: 6.4 g/dL — ABNORMAL LOW (ref 6.5–8.1)

## 2021-01-10 MED ORDER — DOCUSATE SODIUM 100 MG PO CAPS: 100.0000 mg | ORAL_CAPSULE | Freq: Every day | ORAL | Status: DC

## 2021-01-10 MED ORDER — LABETALOL HCL 5 MG/ML IV SOLN
20.0000 mg | INTRAVENOUS | Status: DC | PRN
  Administered 2021-01-10: 20 mg via INTRAVENOUS
  Filled 2021-01-10 (×3): qty 4

## 2021-01-10 MED ORDER — LACTATED RINGERS IV SOLN: INTRAVENOUS | Status: DC

## 2021-01-10 MED ORDER — ZOLPIDEM TARTRATE 5 MG PO TABS: 5.0000 mg | ORAL_TABLET | Freq: Every evening | ORAL | Status: DC | PRN

## 2021-01-10 MED ORDER — CALCIUM CARBONATE ANTACID 500 MG PO CHEW: 2.0000 | CHEWABLE_TABLET | ORAL | Status: DC | PRN

## 2021-01-10 MED ORDER — MAGNESIUM SULFATE 40 GM/1000ML IV SOLN
INTRAVENOUS | Status: AC
  Administered 2021-01-10: 6 g via INTRAVENOUS
  Filled 2021-01-10: qty 1000

## 2021-01-10 MED ORDER — LABETALOL HCL 5 MG/ML IV SOLN: 80.0000 mg | INTRAVENOUS | Status: DC | PRN

## 2021-01-10 MED ORDER — PROMETHAZINE HCL 25 MG PO TABS: 25.0000 mg | ORAL_TABLET | Freq: Once | ORAL | Status: DC

## 2021-01-10 MED ORDER — LABETALOL HCL 5 MG/ML IV SOLN
40.0000 mg | INTRAVENOUS | Status: DC | PRN
  Administered 2021-01-10: 40 mg via INTRAVENOUS
  Filled 2021-01-10 (×3): qty 8

## 2021-01-10 MED ORDER — ACETAMINOPHEN 325 MG PO TABS: 650.0000 mg | ORAL_TABLET | ORAL | Status: DC | PRN

## 2021-01-10 MED ORDER — HYDRALAZINE HCL 20 MG/ML IJ SOLN
10.0000 mg | INTRAMUSCULAR | Status: DC | PRN
  Administered 2021-01-10: 10 mg via INTRAVENOUS
  Filled 2021-01-10 (×2): qty 1

## 2021-01-10 MED ORDER — MAGNESIUM SULFATE BOLUS VIA INFUSION
6.0000 g | Freq: Once | INTRAVENOUS | Status: AC
  Filled 2021-01-10: qty 1000

## 2021-01-10 MED ORDER — ACETAMINOPHEN 500 MG PO TABS
1000.0000 mg | ORAL_TABLET | Freq: Once | ORAL | Status: AC
  Administered 2021-01-10: 1000 mg via ORAL
  Filled 2021-01-10: qty 2

## 2021-01-10 MED ORDER — PRENATAL MULTIVITAMIN CH: 1.0000 | ORAL_TABLET | Freq: Every day | ORAL | Status: DC

## 2021-01-10 MED ORDER — HYDRALAZINE HCL 20 MG/ML IJ SOLN
10.0000 mg | INTRAMUSCULAR | Status: DC | PRN
  Administered 2021-01-10 – 2021-01-11 (×3): 10 mg via INTRAVENOUS
  Filled 2021-01-10: qty 1

## 2021-01-10 MED ORDER — LABETALOL HCL 5 MG/ML IV SOLN
20.0000 mg | INTRAVENOUS | Status: DC | PRN
  Administered 2021-01-11 (×2): 20 mg via INTRAVENOUS

## 2021-01-10 MED ORDER — MAGNESIUM SULFATE 40 GM/1000ML IV SOLN
2.0000 g/h | INTRAVENOUS | Status: AC
  Administered 2021-01-10 – 2021-01-11 (×2): 2 g/h via INTRAVENOUS
  Filled 2021-01-10: qty 1000

## 2021-01-10 MED ORDER — LABETALOL HCL 5 MG/ML IV SOLN
40.0000 mg | INTRAVENOUS | Status: DC | PRN
  Administered 2021-01-11: 40 mg via INTRAVENOUS

## 2021-01-10 NOTE — MAU Note (Signed)
Transfer from [facility]. PP-Csection on 01/01/21. Pt c/o headache for several days took ibuprofen without relief. On B/P meds for gest HTN. C/O dizzyness, blurred vision and nausea. Denies abd pain.

## 2021-01-10 NOTE — MAU Note (Signed)
Asking pt orientation questions and pt stated she can't concentrate. Answers very slowly then became unresponsive. Eyelids blinking but pt non verbal. Called provider to bedside. Serenal rub given and pt became awake.  Pt still very slow to respond verbally. B/p remains elevated (see flow sheet). Apresoline given and Magnesium sulfate 6gm bolus given.

## 2021-01-10 NOTE — ED Triage Notes (Signed)
Pt c/o headache, nausea/vomiting x 2 days. Post partum x 1 week.

## 2021-01-10 NOTE — ED Provider Notes (Signed)
Emergency Medicine Provider OB Triage Evaluation Note  Amy Elliott is a 20 y.o. female, G1P1001, at Unknown gestation who presents to the emergency department with complaints of headache, nausea, vomiting, and intermittently blurry vision.  She is 1 week post partum.  Denies fevers or chills.  Denies weakness in extremities, but does endorse fatigue.  Review of  Systems  Positive: headache, nausea, vomiting, intermittently blurry vision Negative: fever, chills, weakness  Physical Exam  BP (!) 189/103    Pulse 62    Temp 98.8 F (37.1 C) (Oral)    Resp 18    SpO2 100%  General: Awake, no distress  HEENT: Atraumatic  Resp: Normal effort  Cardiac: Normal rate Abd: Nondistended, nontender  MSK: Moves all extremities without difficulty Neuro: Speech clear  Medical Decision Making  Pt evaluated for pregnancy concern and is stable for transfer to MAU. Pt is in agreement with plan for transfer.  10:10 PM Discussed with MAU APP, Denny Peon, who accepts patient in transfer.  Clinical Impression   1. Nonintractable headache, unspecified chronicity pattern, unspecified headache type        Roxy Horseman, PA-C 01/10/21 2212    Vanetta Mulders, MD 01/22/21 [phone]

## 2021-01-10 NOTE — H&P (Addendum)
[facility]AC[facility] PRACTICE ANTEPARTUM ADMISSION HISTORY AND PHYSICAL NOTE   History of Present Illness: Amy Elliott is a 20 y.o. G1P1001 at Unknown admitted for postpartum preeclampsia.  Patient is 1 week s/p primary c/section for NRNST. She diagnosed with gestational hypertension & discharged on nifedipine 30 & a course of lasix. BPs at time of discharge were stable. She completed her lasix & has not missed any nifedipine doses. Reports severe headache for the last 2 days that has worsened today. Rates pain 8/10. Has been taking ibuprofen with no relief. Reports associated vision changes that worsened this evening - states she is dizzy & has trouble focusing due to blurred vision. Endorses nausea but no vomiting. Denies fever, neck pain, chest pain, SOB, or abdominal pain.   Patient Active Problem List   Diagnosis Date Noted   Preeclampsia in postpartum period 01/10/2021   Postpartum care following cesarean delivery 01/06/2021   Gestational hypertension 12/30/2020   Atypical squamous cell changes of undetermined significance (ASCUS) on vaginal cytology 06/23/2020   Moderate episode of recurrent major depressive disorder (HCC) 04/07/2017   Generalized anxiety disorder 04/07/2017   Sleep difficulties 04/07/2017   Morbid obesity (HCC) 08/09/2016    Past Medical History:  Diagnosis Date   Anemia     Past Surgical History:  Procedure Laterality Date   CESAREAN SECTION  01/01/2021   Procedure: CESAREAN SECTION;  Surgeon: Federico Flake, MD;  Location: [facility];  Service: Obstetrics;;   WISDOM TOOTH EXTRACTION  2020    OB History  Gravida Para Term Preterm AB Living  1 1 1  0 0 1  SAB IAB Ectopic Multiple Live Births  0 0 0 0 1    # Outcome Date GA Lbr Len/2nd Weight Sex Delivery Anes PTL Lv  1 Term 01/01/21 [redacted]w[redacted]d  3770 g F CS-LTranv Spinal  LIV    Social History   Socioeconomic History   Marital status: Single    Spouse name: Not on file   Number of children: Not on file    Years of education: Not on file   Highest education level: Not on file  Occupational History   Not on file  Tobacco Use   Smoking status: Never   Smokeless tobacco: Never  Vaping Use   Vaping Use: Never used  Substance and Sexual Activity   Alcohol use: Not Currently    Comment: not since confirmed pregnancy. Last drink in January   Drug use: Not Currently    Types: Marijuana    Comment: April 2022   Sexual activity: Yes    Partners: Male    Birth control/protection: None  Other Topics Concern   Not on file  Social History Narrative   Patient's number is [phone] (mother's is [phone])   Social Determinants of Health   Financial Resource Strain: Not on file  Food Insecurity: Not on file  Transportation Needs: Not on file  Physical Activity: Not on file  Stress: Not on file  Social Connections: Not on file    Family History  Problem Relation Age of Onset   Hypertension Mother    Varicose Veins Father    Diabetes Maternal Grandfather     Allergies  Allergen Reactions   Banana    Other Itching    Grapes with seeds    Medications Prior to Admission  Medication Sig Dispense Refill Last Dose   furosemide (LASIX) 20 MG tablet Take 1 tablet (20 mg total) by mouth daily. 4 tablet 0 01/10/2021   ibuprofen (  ADVIL) 600 MG tablet Take 1 tablet (600 mg total) by mouth every 6 (six) hours. 60 tablet 0 01/10/2021   NIFEdipine (ADALAT CC) 30 MG 24 hr tablet Take 1 tablet (30 mg total) by mouth 2 (two) times daily. 90 tablet 0 01/10/2021   acetaminophen (TYLENOL) 500 MG tablet Take 1,000 mg by mouth every 6 (six) hours as needed for headache. (Patient not taking: Reported on 01/06/2021)      acetaminophen (TYLENOL) 500 MG tablet Take 2 tablets (1,000 mg total) by mouth every 6 (six) hours. (Patient not taking: Reported on 01/06/2021) 30 tablet 0    Drospirenone (SLYND) 4 MG TABS Take 1 tablet by mouth daily. Start on the Sunday after the baby turns 77 weeks old (Patient not  taking: Reported on 01/06/2021) 90 tablet 0    ferrous sulfate 325 (65 FE) MG tablet Take 1 tablet (325 mg total) by mouth every other day. 30 tablet 0    Iron Polysacch Cmplx-B12-FA 150-0.025-1 MG CAPS Take 1 capsule by mouth every other day. 30 capsule 5    Prenat-Fe Poly-Methfol-FA-DHA (VITAFOL ULTRA) 29-0.6-0.4-200 MG CAPS Take 1 tablet by mouth daily. 90 capsule 3    triamterene-hydrochlorothiazide (MAXZIDE-25) 37.5-25 MG tablet Take 1 tablet by mouth 2 (two) times daily. 14 tablet 0     Review of Systems - Negative except headache, nausea, blurred vision History obtained from the patient  Vitals:  BP (!) 169/87    Pulse 83    Temp 98.8 F (37.1 C) (Oral)    Resp 20    SpO2 98%   Patient Vitals for the past 24 hrs:  BP Temp Temp src Pulse Resp SpO2  01/11/21 0044 (!) 169/87 -- -- -- -- --  01/11/21 0020 -- -- -- -- -- 98 %  01/11/21 0016 (!) 167/92 -- -- 83 -- --  01/11/21 0010 (!) 178/95 -- -- 86 -- 99 %  01/11/21 0001 (!) 173/90 -- -- 82 -- --  01/10/21 2351 (!) 169/94 -- -- 75 -- --  01/10/21 2350 -- -- -- -- -- 96 %  01/10/21 2341 (!) 157/99 -- -- 74 20 --  01/10/21 2340 -- -- -- -- -- 94 %  01/10/21 2331 (!) 194/121 -- -- (!) 51 -- --  01/10/21 2322 (!) 180/98 -- -- 70 -- --  01/10/21 2300 (!) 179/102 -- -- 64 -- --  01/10/21 2246 (!) 182/100 -- -- 64 -- --  01/10/21 2202 (!) 189/103 98.8 F (37.1 C) Oral 62 18 100 %    Physical Examination: Physical Examination: General appearance - overweight and appears uncomfortable Mental status - confused, slow to respond Eyes - pupils equal and reactive, extraocular eye movements intact, sclera anicteric Chest - clear to auscultation, no wheezes, rales or rhonchi, symmetric air entry Heart - normal rate, regular rhythm, normal S1, S2, no murmurs, rubs, clicks or gallops Neurological - DTR's normal and symmetric, normal muscle tone, no tremors, strength 5/5, no clonus Extremities - bilateral pedal edema 3 +  Labs:  Results  for orders placed or performed during the hospital encounter of 01/10/21 (from the past 24 hour(s))  Comprehensive metabolic panel   Collection Time: 01/10/21 11:09 PM  Result Value Ref Range   Sodium 139 135 - 145 mmol/L   Potassium 4.5 3.5 - 5.1 mmol/L   Chloride 110 98 - 111 mmol/L   CO2 16 (L) 22 - 32 mmol/L   Glucose, Bld 100 (H) 70 - 99 mg/dL   BUN 51 (  H) 6 - 20 mg/dL   Creatinine, Ser 65.79 (H) 0.44 - 1.00 mg/dL   Calcium 8.7 (L) 8.9 - 10.3 mg/dL   Total Protein 6.4 (L) 6.5 - 8.1 g/dL   Albumin 2.6 (L) 3.5 - 5.0 g/dL   AST 17 15 - 41 U/L   ALT 17 0 - 44 U/L   Alkaline Phosphatase 119 38 - 126 U/L   Total Bilirubin 0.6 0.3 - 1.2 mg/dL   GFR, Estimated 5 (L) >60 mL/min   Anion gap 13 5 - 15  CBC   Collection Time: 01/10/21 11:09 PM  Result Value Ref Range   WBC 6.5 4.0 - 10.5 K/uL   RBC 2.98 (L) 3.87 - 5.11 MIL/uL   Hemoglobin 8.4 (L) 12.0 - 15.0 g/dL   HCT 03.8 (L) 33.3 - 83.2 %   MCV 85.6 80.0 - 100.0 fL   MCH 28.2 26.0 - 34.0 pg   MCHC 32.9 30.0 - 36.0 g/dL   RDW 91.9 16.6 - 06.0 %   Platelets 329 150 - 400 K/uL   nRBC 0.0 0.0 - 0.2 %    Imaging Studies: pending   Assessment and Plan: 1. Eclampsia in postpartum period   2. Nonintractable headache, unspecified chronicity pattern, unspecified headache type   3. Acute Kidney Injury  -start IV Magnesium -IV Hypertensive protocol ordered and given -Foley placed, strict I/Os -Head CT ordered -increased to Procardia XL 90mg  daily -Close monitoring of kidney function -Mag level @ 0300  , NP 01/11/2021 12:47 AM

## 2021-01-11 ENCOUNTER — Encounter (HOSPITAL_COMMUNITY): Payer: Self-pay | Admitting: Obstetrics & Gynecology

## 2021-01-11 ENCOUNTER — Inpatient Hospital Stay (HOSPITAL_COMMUNITY): Payer: Medicaid Other

## 2021-01-11 DIAGNOSIS — R519 Headache, unspecified: Secondary | ICD-10-CM | POA: Diagnosis not present

## 2021-01-11 DIAGNOSIS — O1495 Unspecified pre-eclampsia, complicating the puerperium: Secondary | ICD-10-CM

## 2021-01-11 LAB — COMPREHENSIVE METABOLIC PANEL
ALT: 18 U/L (ref 0–44)
AST: 17 U/L (ref 15–41)
Albumin: 2.6 g/dL — ABNORMAL LOW (ref 3.5–5.0)
Alkaline Phosphatase: 119 U/L (ref 38–126)
Anion gap: 15 (ref 5–15)
BUN: 49 mg/dL — ABNORMAL HIGH (ref 6–20)
CO2: 16 mmol/L — ABNORMAL LOW (ref 22–32)
Calcium: 9 mg/dL (ref 8.9–10.3)
Chloride: 108 mmol/L (ref 98–111)
Creatinine, Ser: 9.25 mg/dL — ABNORMAL HIGH (ref 0.44–1.00)
GFR, Estimated: 6 mL/min — ABNORMAL LOW (ref >60)
Glucose, Bld: 104 mg/dL — ABNORMAL HIGH (ref 70–99)
Potassium: 4.1 mmol/L (ref 3.5–5.1)
Sodium: 139 mmol/L (ref 135–145)
Total Bilirubin: 0.6 mg/dL (ref 0.3–1.2)
Total Protein: 6.5 g/dL (ref 6.5–8.1)

## 2021-01-11 LAB — CBC
HCT: 26.2 % — ABNORMAL LOW (ref 36.0–46.0)
Hemoglobin: 9 g/dL — ABNORMAL LOW (ref 12.0–15.0)
MCH: 28.3 pg (ref 26.0–34.0)
MCHC: 34.4 g/dL (ref 30.0–36.0)
MCV: 82.4 fL (ref 80.0–100.0)
Platelets: 379 10*3/uL (ref 150–400)
RBC: 3.18 MIL/uL — ABNORMAL LOW (ref 3.87–5.11)
RDW: 13.1 % (ref 11.5–15.5)
WBC: 10.4 10*3/uL (ref 4.0–10.5)
nRBC: 0 % (ref 0.0–0.2)

## 2021-01-11 LAB — RESP PANEL BY RT-PCR (FLU A&B, COVID) ARPGX2
Influenza A by PCR: NEGATIVE
Influenza B by PCR: NEGATIVE
SARS Coronavirus 2 by RT PCR: NEGATIVE

## 2021-01-11 LAB — MAGNESIUM: Magnesium: 5.4 mg/dL — ABNORMAL HIGH (ref 1.7–2.4)

## 2021-01-11 MED ORDER — ACETAMINOPHEN 325 MG PO TABS
650.0000 mg | ORAL_TABLET | Freq: Four times a day (QID) | ORAL | Status: DC | PRN
  Administered 2021-01-11 (×2): 650 mg via ORAL
  Filled 2021-01-11 (×2): qty 2

## 2021-01-11 MED ORDER — NIFEDIPINE ER OSMOTIC RELEASE 30 MG PO TB24: 90.0000 mg | ORAL_TABLET | Freq: Every day | ORAL | Status: DC

## 2021-01-11 MED ORDER — HYDRALAZINE HCL 10 MG PO TABS
10.0000 mg | ORAL_TABLET | Freq: Once | ORAL | Status: AC
  Filled 2021-01-11: qty 1

## 2021-01-11 MED ORDER — NIFEDIPINE ER OSMOTIC RELEASE 60 MG PO TB24
90.0000 mg | ORAL_TABLET | Freq: Every day | ORAL | Status: DC
  Administered 2021-01-11 – 2021-01-13 (×4): 90 mg via ORAL
  Filled 2021-01-11: qty 3
  Filled 2021-01-11 (×3): qty 1

## 2021-01-11 MED ORDER — ZOLPIDEM TARTRATE 5 MG PO TABS: 5.0000 mg | ORAL_TABLET | Freq: Every evening | ORAL | Status: DC | PRN

## 2021-01-11 MED ORDER — SODIUM CHLORIDE 0.9 % IV SOLN
8.0000 mg | Freq: Three times a day (TID) | INTRAVENOUS | Status: DC | PRN
  Administered 2021-01-11: 8 mg via INTRAVENOUS
  Filled 2021-01-11: qty 4

## 2021-01-11 NOTE — Progress Notes (Signed)
Post Partum Day 10 from c section, HD # 1 for eclampsia and AKI Subjective: Pt reports feeling better. Still with a mild HA. Visual changes have resolved. Tolerating diet  Objective: Blood pressure (!) 147/84, pulse 91, temperature 97.7 F (36.5 C), temperature source Oral, resp. rate 18, height 5\' 6"  (1.676 m), SpO2 96 %, currently breastfeeding.  Physical Exam:  General: alert Lochia: appropriate Uterine Fundus: firm Incision: healing well DVT Evaluation: No evidence of DVT seen on physical exam.  Recent Labs    01/10/21 2309 01/11/21 0258  HGB 8.4* 9.0*  HCT 25.5* 26.2*    Assessment/Plan: Magnesium x 24 hours. Head CT normal. Continue with antihypertensive medication. BP improved. Renal function improving, will continue to follow. Continue with current management   LOS: 1 day   03/11/21 01/11/2021, 9:56 AM

## 2021-01-12 DIAGNOSIS — O1495 Unspecified pre-eclampsia, complicating the puerperium: Secondary | ICD-10-CM | POA: Diagnosis not present

## 2021-01-12 DIAGNOSIS — R519 Headache, unspecified: Secondary | ICD-10-CM | POA: Diagnosis not present

## 2021-01-12 LAB — COMPREHENSIVE METABOLIC PANEL
ALT: 15 U/L (ref 0–44)
AST: 15 U/L (ref 15–41)
Albumin: 2.6 g/dL — ABNORMAL LOW (ref 3.5–5.0)
Alkaline Phosphatase: 105 U/L (ref 38–126)
Anion gap: 13 (ref 5–15)
BUN: 29 mg/dL — ABNORMAL HIGH (ref 6–20)
CO2: 23 mmol/L (ref 22–32)
Calcium: 8.4 mg/dL — ABNORMAL LOW (ref 8.9–10.3)
Chloride: 102 mmol/L (ref 98–111)
Creatinine, Ser: 4.45 mg/dL — ABNORMAL HIGH (ref 0.44–1.00)
GFR, Estimated: 14 mL/min — ABNORMAL LOW (ref >60)
Glucose, Bld: 145 mg/dL — ABNORMAL HIGH (ref 70–99)
Potassium: 3.3 mmol/L — ABNORMAL LOW (ref 3.5–5.1)
Sodium: 138 mmol/L (ref 135–145)
Total Bilirubin: 0.4 mg/dL (ref 0.3–1.2)
Total Protein: 6.5 g/dL (ref 6.5–8.1)

## 2021-01-12 LAB — CBC
HCT: 28.7 % — ABNORMAL LOW (ref 36.0–46.0)
Hemoglobin: 9.7 g/dL — ABNORMAL LOW (ref 12.0–15.0)
MCH: 28.3 pg (ref 26.0–34.0)
MCHC: 33.8 g/dL (ref 30.0–36.0)
MCV: 83.7 fL (ref 80.0–100.0)
Platelets: 438 10*3/uL — ABNORMAL HIGH (ref 150–400)
RBC: 3.43 MIL/uL — ABNORMAL LOW (ref 3.87–5.11)
RDW: 13.4 % (ref 11.5–15.5)
WBC: 9 10*3/uL (ref 4.0–10.5)
nRBC: 0 % (ref 0.0–0.2)

## 2021-01-12 MED ORDER — POLYETHYLENE GLYCOL 3350 17 G PO PACK
17.0000 g | PACK | Freq: Every day | ORAL | Status: DC | PRN
  Administered 2021-01-12 – 2021-01-13 (×2): 17 g via ORAL
  Filled 2021-01-12 (×2): qty 1

## 2021-01-12 MED ORDER — DOCUSATE SODIUM 100 MG PO CAPS
100.0000 mg | ORAL_CAPSULE | Freq: Two times a day (BID) | ORAL | Status: DC
  Administered 2021-01-12 – 2021-01-13 (×2): 100 mg via ORAL
  Filled 2021-01-12 (×2): qty 1

## 2021-01-12 MED ORDER — BISACODYL 5 MG PO TBEC
5.0000 mg | DELAYED_RELEASE_TABLET | Freq: Every day | ORAL | Status: DC
  Administered 2021-01-12 – 2021-01-13 (×2): 5 mg via ORAL
  Filled 2021-01-12 (×2): qty 1

## 2021-01-12 NOTE — Progress Notes (Signed)
Post Partum Day 11 from c section, HD # 2 for eclampsia and AKI Subjective: Pt reports feeling better. Denies HA or visual changes. Tolerating diet.  Objective: Blood pressure 132/64, pulse 88, temperature 98.4 F (36.9 C), temperature source Oral, resp. rate 16, height 5\' 6"  (1.676 m), SpO2 98 %, currently breastfeeding.  Physical Exam:  General: alert Lochia: appropriate Uterine Fundus: firm Incision: healing well DVT Evaluation: No evidence of DVT seen on physical exam.  Recent Labs    01/11/21 0258 01/12/21 0518  HGB 9.0* 9.7*  HCT 26.2* 28.7*    Assessment/Plan: Stable. S/P Magnesium. BP controlled with current regiment. Renal function improving. If remains stable, plan for discharge home tomorrow.    LOS: 2 days   Chancy Milroy 01/12/2021, 7:01 AM

## 2021-01-13 DIAGNOSIS — R519 Headache, unspecified: Secondary | ICD-10-CM | POA: Diagnosis not present

## 2021-01-13 DIAGNOSIS — O1495 Unspecified pre-eclampsia, complicating the puerperium: Secondary | ICD-10-CM | POA: Diagnosis not present

## 2021-01-13 LAB — COMPREHENSIVE METABOLIC PANEL
ALT: 14 U/L (ref 0–44)
AST: 16 U/L (ref 15–41)
Albumin: 2.6 g/dL — ABNORMAL LOW (ref 3.5–5.0)
Alkaline Phosphatase: 95 U/L (ref 38–126)
Anion gap: 10 (ref 5–15)
BUN: 24 mg/dL — ABNORMAL HIGH (ref 6–20)
CO2: 27 mmol/L (ref 22–32)
Calcium: 8.5 mg/dL — ABNORMAL LOW (ref 8.9–10.3)
Chloride: 103 mmol/L (ref 98–111)
Creatinine, Ser: 3.37 mg/dL — ABNORMAL HIGH (ref 0.44–1.00)
GFR, Estimated: 19 mL/min — ABNORMAL LOW (ref >60)
Glucose, Bld: 96 mg/dL (ref 70–99)
Potassium: 3.8 mmol/L (ref 3.5–5.1)
Sodium: 140 mmol/L (ref 135–145)
Total Bilirubin: 0.6 mg/dL (ref 0.3–1.2)
Total Protein: 6.3 g/dL — ABNORMAL LOW (ref 6.5–8.1)

## 2021-01-13 MED ORDER — NIFEDIPINE ER OSMOTIC RELEASE 90 MG PO TB24: 90.0000 mg | ORAL_TABLET | Freq: Every day | ORAL | 1 refills | Status: DC

## 2021-01-13 NOTE — Progress Notes (Signed)
Discharge instructions and prescriptions given to pt. Discussed signs and symptoms to report to the MD, upcoming appointments, and prescriptions. Pt verbalizes understanding and has no questions or concerns at this time. Pt discharged home from hospital in stable condition.

## 2021-01-13 NOTE — Discharge Summary (Signed)
Postpartum Discharge Summary    Patient Name: Amy Elliott DOB: 09-Oct-2000 MRN: [id number]  Date of admission: 01/10/2021 Delivery date:This patient has no babies on file. Delivering provider: This patient has no babies on file. Date of discharge: 01/13/2021  Admitting diagnosis: Nonintractable headache, unspecified chronicity pattern, unspecified headache type [R51.9] Preeclampsia in postpartum period [O14.95] Intrauterine pregnancy: Unknown     Secondary diagnosis:  Principal Problem:   Preeclampsia in postpartum period  Additional problems: Acute kidney injury    Discharge diagnosis:  Eclampsia                                               Post partum procedures: None Complications: Maternal Seizure  Hospital course: The patient was admitted following an eclamptic seizure in the setting of postpartum severe PreE based on a severe headache and severe range blood pressures. She had also noticed she had stopped urinating as much and had swelling. She was started on Magnesium and for her HA, she had a CT which was normal. She was restarted on her Procardia after receiving IV anti-hypertensives to help her blood pressure normalize. Her creatinine on admission was 10. With fluids and blood pressure control, her kidney function quickly improved. Her headache improved by 01/12/21. By [facility], today, she was doing well - voiding spontaneously, no headache and Bps were normotensive to mild range on the Procardia 90 daily. She felt ready to go home. Her creatinine this morning was 3.37, a continued down trend. We established a plan for follow up until her creatinine normalizes back to her baseline, around 0.8.   Magnesium Sulfate received: Yes: Seizure prophylaxis   Physical exam  Vitals:   01/13/21 0002 01/13/21 0501 01/13/21 0758 01/13/21 1143  BP: 113/63 128/67 (!) 133/59 (!) 149/75  Pulse: 84 75 74 70  Resp: 17 18 18 18   Temp: 98.2 F (36.8 C) 98.4 F (36.9 C) 98.3 F (36.8 C) 98.3 F  (36.8 C)  TempSrc: Oral Oral Oral Oral  SpO2: 100% 100% 100% 100%  Height:       General: alert, cooperative, and no distress CV: RRR Pulm: Normal effort, CTAB Lochia: appropriate Uterine Fundus: firm Abdomen: S/NT/ND, + BS Incision: well healed DVT Evaluation: No evidence of DVT seen on physical exam, SCDs in place  Labs: Lab Results  Component Value Date   WBC 9.0 01/12/2021   HGB 9.7 (L) 01/12/2021   HCT 28.7 (L) 01/12/2021   MCV 83.7 01/12/2021   PLT 438 (H) 01/12/2021   CMP Latest Ref Rng & Units 01/13/2021  Glucose 70 - 99 mg/dL 96  BUN 6 - 20 mg/dL 03/13/2021)  Creatinine 67(Y - 1.00 mg/dL 1.95)  Sodium 0.93(O - 671 mmol/L 140  Potassium 3.5 - 5.1 mmol/L 3.8  Chloride 98 - 111 mmol/L 103  CO2 22 - 32 mmol/L 27  Calcium 8.9 - 10.3 mg/dL 245)  Total Protein 6.5 - 8.1 g/dL 6.3(L)  Total Bilirubin 0.3 - 1.2 mg/dL 0.6  Alkaline Phos 38 - 126 U/L 95  AST 15 - 41 U/L 16  ALT 0 - 44 U/L 14   Edinburgh Score: Edinburgh Postnatal Depression Scale Screening Tool 01/03/2021  I have been able to laugh and see the funny side of things. 0  I have looked forward with enjoyment to things. 0  I have blamed myself unnecessarily when things went  wrong. 1  I have been anxious or worried for no good reason. 2  I have felt scared or panicky for no good reason. 1  Things have been getting on top of me. 1  I have been so unhappy that I have had difficulty sleeping. 0  I have felt sad or miserable. 0  I have been so unhappy that I have been crying. 1  The thought of harming myself has occurred to me. 0  Edinburgh Postnatal Depression Scale Total 6     After visit meds:  Allergies as of 01/13/2021       Reactions   Banana    Other Itching   Grapes with seeds        Medication List     STOP taking these medications    acetaminophen 500 MG tablet Commonly known as: TYLENOL   furosemide 20 MG tablet Commonly known as: LASIX   ibuprofen 600 MG tablet Commonly known as:  ADVIL   Iron Polysacch Cmplx-B12-FA 150-0.025-1 MG Caps       TAKE these medications    ferrous sulfate 325 (65 FE) MG tablet Take 1 tablet (325 mg total) by mouth every other day.   NIFEdipine 90 MG 24 hr tablet Commonly known as: PROCARDIA XL/NIFEDICAL-XL Take 1 tablet (90 mg total) by mouth daily. Start taking on: January 14, 2021 What changed:  medication strength how much to take when to take this   Slynd 4 MG Tabs Generic drug: Drospirenone Take 1 tablet by mouth daily. Start on the Sunday after the baby turns 74 weeks old   triamterene-hydrochlorothiazide 37.5-25 MG tablet Commonly known as: Maxzide-25 Take 1 tablet by mouth 2 (two) times daily.   Vitafol Ultra 29-0.6-0.4-200 MG Caps Take 1 tablet by mouth daily.         Discharge home in stable condition  Discharge instruction: per After Visit Summary and Postpartum booklet. Activity: Advance as tolerated. Pelvic rest for 6 weeks.  Diet: low salt diet Future Appointments: Future Appointments  Date Time Provider Department Center  01/14/2021 10:00 AM [facility] NURSE [facility] None  02/13/2021 10:15 AM Brock Bad, MD [facility] None   Follow up Visit:  Follow-up Information     [facility] Follow up.   Specialty: Obstetrics and Gynecology Why: Tomorrow as scheduled. A note has been sent to the office to also do the blood work. Contact information: [address] [place] [address] [phone]                 Please schedule this patient for a In person postpartum visit in 4 weeks with the following provider: MD. Additional Postpartum F/U: BP and lab check tomorrow   High risk pregnancy complicated by:  postpartum eclampsia with acute kidney injury Delivery mode:  This patient has no babies on file.  01/13/2021 Milas Hock, MD

## 2021-01-14 ENCOUNTER — Other Ambulatory Visit: Payer: Self-pay

## 2021-01-14 ENCOUNTER — Ambulatory Visit (INDEPENDENT_AMBULATORY_CARE_PROVIDER_SITE_OTHER): Payer: Medicaid Other

## 2021-01-14 ENCOUNTER — Telehealth (HOSPITAL_COMMUNITY): Payer: Self-pay | Admitting: *Deleted

## 2021-01-14 VITALS — BP 157/101 | HR 57 | Ht 66.0 in | Wt 262.0 lb

## 2021-01-14 DIAGNOSIS — O1495 Unspecified pre-eclampsia, complicating the puerperium: Secondary | ICD-10-CM | POA: Diagnosis not present

## 2021-01-14 NOTE — Telephone Encounter (Signed)
Patient voiced no questions or concerns at this time. Was seen by MD today for BP check. Has follow-up apointment scheduled for next week, per patient report. EPDS=3. Patient voiced no questions or concerns regarding infant at this time. Patient reports infant sleeps in a bassinet on her back. RN reviewed ABCs of safe sleep. Patient verbalized understanding. Patient requested RN email information on hospital's virtual postpartum classes and support groups. Email sent. Deforest Hoyles, RN, 01/14/21, [phone]

## 2021-01-14 NOTE — Progress Notes (Signed)
Agree with nurses's documentation of this patient's clinic encounter.  Candela Krul L, MD  

## 2021-01-14 NOTE — Progress Notes (Signed)
Subjective:  Amy Elliott is a 21 y.o. female here for BP check.   Hypertension ROS: taking medications as instructed, no medication side effects noted, no TIA's, no chest pain on exertion, no dyspnea on exertion, and no swelling of ankles.    Objective:  BP (!) 157/101 (BP Location: Left Arm, Cuff Size: Large)    Pulse (!) 57    Ht 5\' 6"  (1.676 m)    Wt 262 lb (118.8 kg)    Breastfeeding Yes    BMI 42.29 kg/m   Appearance alert, well appearing, and in no distress and overweight. General exam BP noted to be well controlled today in office.    Assessment:   Blood Pressure asymptomatic and poorly controlled.   Plan:  Per Dr. CMET drawn today, continue taking medications as directed, check BP at home and call Office if you are having sx of Preeclampsia and return in 1 week for BP check .

## 2021-01-15 LAB — COMPREHENSIVE METABOLIC PANEL
ALT: 14 IU/L (ref 0–32)
AST: 21 IU/L (ref 0–40)
Albumin/Globulin Ratio: 1.4 (ref 1.2–2.2)
Albumin: 4.2 g/dL (ref 3.9–5.0)
Alkaline Phosphatase: 122 IU/L — ABNORMAL HIGH (ref 44–121)
BUN/Creatinine Ratio: 9 (ref 9–23)
BUN: 20 mg/dL (ref 6–20)
Bilirubin Total: 0.2 mg/dL (ref 0.0–1.2)
CO2: 25 mmol/L (ref 20–29)
Calcium: 10 mg/dL (ref 8.7–10.2)
Chloride: 100 mmol/L (ref 96–106)
Creatinine, Ser: 2.3 mg/dL — ABNORMAL HIGH (ref 0.57–1.00)
Globulin, Total: 3 g/dL (ref 1.5–4.5)
Glucose: 71 mg/dL (ref 70–99)
Potassium: 4.6 mmol/L (ref 3.5–5.2)
Sodium: 141 mmol/L (ref 134–144)
Total Protein: 7.2 g/dL (ref 6.0–8.5)
eGFR: 30 mL/min/{1.73_m2} — ABNORMAL LOW (ref >59)

## 2021-01-21 ENCOUNTER — Ambulatory Visit (INDEPENDENT_AMBULATORY_CARE_PROVIDER_SITE_OTHER): Payer: Medicaid Other | Admitting: *Deleted

## 2021-01-21 ENCOUNTER — Other Ambulatory Visit: Payer: Self-pay

## 2021-01-21 VITALS — BP 121/80 | HR 80

## 2021-01-21 DIAGNOSIS — O135 Gestational [pregnancy-induced] hypertension without significant proteinuria, complicating the puerperium: Secondary | ICD-10-CM

## 2021-01-21 DIAGNOSIS — O1495 Unspecified pre-eclampsia, complicating the puerperium: Secondary | ICD-10-CM

## 2021-01-21 NOTE — Progress Notes (Signed)
Subjective:  Amy Elliott is a 21 y.o. female here for BP check.   Hypertension ROS: taking medications as instructed, no medication side effects noted, no TIA's, no chest pain on exertion, no dyspnea on exertion, and no swelling of ankles. Reports occasional headaches, not intractable and occasional heartburn and seeing "spots." No acute distress noted.   Objective:  BP 121/80 P 80 Appearance alert, well appearing, and in no distress and oriented to person, place, and time. General exam BP noted to be well controlled today in office.    Assessment:   Blood Pressure well controlled.   Plan:  Current treatment plan is effective, no change in therapy.. Per Dr. Donavan Foil continue Procardia 90 mg XL, discontinue Mazide, return as scheduled for 6 wk postpartum visit. Patient instructed to check BP weekly at on same day of the week at around the same time and report problems. Instructed to seek care for intractable headache, or other emergent concerns.

## 2021-02-13 ENCOUNTER — Other Ambulatory Visit: Payer: Self-pay

## 2021-02-13 ENCOUNTER — Encounter: Payer: Self-pay | Admitting: Obstetrics

## 2021-02-13 ENCOUNTER — Ambulatory Visit (INDEPENDENT_AMBULATORY_CARE_PROVIDER_SITE_OTHER): Payer: Medicaid Other | Admitting: Obstetrics

## 2021-02-13 VITALS — BP 148/87 | HR 67 | Ht 66.0 in | Wt 263.6 lb

## 2021-02-13 DIAGNOSIS — Z6841 Body Mass Index (BMI) 40.0 and over, adult: Secondary | ICD-10-CM

## 2021-02-13 DIAGNOSIS — Z3009 Encounter for other general counseling and advice on contraception: Secondary | ICD-10-CM | POA: Diagnosis not present

## 2021-02-13 DIAGNOSIS — O165 Unspecified maternal hypertension, complicating the puerperium: Secondary | ICD-10-CM

## 2021-02-13 MED ORDER — TRIAMTERENE-HCTZ 37.5-25 MG PO CAPS: 1.0000 | ORAL_CAPSULE | Freq: Every day | ORAL | 5 refills | Status: DC

## 2021-02-13 NOTE — Progress Notes (Signed)
..   Post Partum Visit Note  Amy Elliott is a 21 y.o. G34P1001 female who presents for a postpartum visit. She is 5 weeks postpartum following a primary cesarean section.  I have fully reviewed the prenatal and intrapartum course. The delivery was at 40.4 gestational weeks.  Anesthesia: spinal. Postpartum course has been good. Baby is doing well. Baby is feeding by both breast and bottle - Gerber . Bleeding no bleeding. Bowel function is normal. Bladder function is normal. Patient is not sexually active. Contraception method is none. Postpartum depression screening: negative.   The pregnancy intention screening data noted above was reviewed. Potential methods of contraception were discussed. The patient elected to proceed with No data recorded.   Edinburgh Postnatal Depression Scale - 02/13/21 1035       Edinburgh Postnatal Depression Scale:  In the Past 7 Days   I have been able to laugh and see the funny side of things. 0    I have looked forward with enjoyment to things. 0    I have blamed myself unnecessarily when things went wrong. 0    I have been anxious or worried for no good reason. 0    I have felt scared or panicky for no good reason. 0    Things have been getting on top of me. 0    I have been so unhappy that I have had difficulty sleeping. 0    I have felt sad or miserable. 0    I have been so unhappy that I have been crying. 0    The thought of harming myself has occurred to me. 0    Edinburgh Postnatal Depression Scale Total 0             Health Maintenance Due  Topic Date Due   COVID-19 Vaccine (1) Never done   Hepatitis C Screening  Never done    The following portions of the patient's history were reviewed and updated as appropriate: allergies, current medications, past family history, past medical history, past social history, past surgical history, and problem list.  Review of Systems A comprehensive review of systems was negative.  Objective:  BP (!)  148/87    Pulse 67    Ht 5\' 6"  (1.676 m)    Wt 263 lb 9.6 oz (119.6 kg)    Breastfeeding No    BMI 42.55 kg/m    General:  alert and no distress   Breasts:  normal  Lungs: clear to auscultation bilaterally  Heart:  regular rate and rhythm, S1, S2 normal, no murmur, click, rub or gallop  Abdomen: soft, non-tender; bowel sounds normal; no masses,  no organomegaly   Wound well approximated incision  GU exam:  not indicated       Assessment:    1. Postpartum hypertension Rx: - triamterene-hydrochlorothiazide (DYAZIDE) 37.5-25 MG capsule; Take 1 each (1 capsule total) by mouth daily before breakfast.  Dispense: 30 capsule; Refill: 5  2. Encounter for other general counseling or advice on contraception - plans to start progestin-only pill after 10 weeks postpartum with good BP control  3. Class 3 severe obesity due to excess calories without serious comorbidity with body mass index (BMI) of 40.0 to 44.9 in adult [facility])     Plan:   Essential components of care per ACOG recommendations:  1.  Mood and well being: Patient with negative depression screening today. Reviewed local resources for support.  - Patient tobacco use? No.   - hx of drug  use? No.    2. Infant care and feeding:  -Patient currently breastmilk feeding? No.  -Social determinants of health (SDOH) reviewed in EPIC. No concerns  3. Sexuality, contraception and birth spacing - Patient does not want a pregnancy in the next year.  Desired family size is undecided   - Reviewed forms of contraception in tiered fashion. Patient desired oral progesterone-only contraceptive today.   - Discussed birth spacing of 18 months  4. Sleep and fatigue -Encouraged family/partner/community support of 4 hrs of uninterrupted sleep to help with mood and fatigue  5. Physical Recovery  - Discussed patients delivery and complications. She describes her labor as mixed. - Patient had a C-section for non-reassuring fetal status. No problems  after delivery, except elevated blood pressure. - Patient has urinary incontinence? No. - Patient is not safe to resume physical and sexual activity  6.  Health Maintenance - HM due items addressed Yes - Last pap smear  Diagnosis  Date Value Ref Range Status  06/19/2020 (A)     - Atypical squamous cells of undetermined significance (ASC-US)   Pap smear not done at today's visit.  -Breast Cancer screening indicated? No  7. Chronic Disease/Pregnancy Condition follow up:  No   Coral Ceo, MD [facility], [facility], [place] 02/13/21

## 2021-03-01 DIAGNOSIS — Z03818 Encounter for observation for suspected exposure to other biological agents ruled out: Secondary | ICD-10-CM | POA: Diagnosis not present

## 2021-03-01 DIAGNOSIS — Z20822 Contact with and (suspected) exposure to covid-19: Secondary | ICD-10-CM | POA: Diagnosis not present

## 2021-03-17 ENCOUNTER — Ambulatory Visit: Payer: Medicaid Other | Admitting: Obstetrics

## 2021-04-01 ENCOUNTER — Encounter: Payer: Self-pay | Admitting: Obstetrics & Gynecology

## 2021-04-01 ENCOUNTER — Ambulatory Visit (INDEPENDENT_AMBULATORY_CARE_PROVIDER_SITE_OTHER): Payer: Medicaid Other | Admitting: Obstetrics & Gynecology

## 2021-04-01 ENCOUNTER — Other Ambulatory Visit: Payer: Self-pay

## 2021-04-01 VITALS — BP 123/77 | HR 54 | Ht 66.5 in | Wt 272.0 lb

## 2021-04-01 DIAGNOSIS — Z30011 Encounter for initial prescription of contraceptive pills: Secondary | ICD-10-CM

## 2021-04-01 DIAGNOSIS — Z0130 Encounter for examination of blood pressure without abnormal findings: Secondary | ICD-10-CM | POA: Diagnosis not present

## 2021-04-01 MED ORDER — DROSPIRENONE-ETHINYL ESTRADIOL 3-0.02 MG PO TABS
1.0000 | ORAL_TABLET | Freq: Every day | ORAL | 11 refills | Status: DC
Start: 2021-04-01 — End: 2023-02-10

## 2021-04-01 NOTE — Progress Notes (Signed)
? ?  GYNECOLOGY OFFICE VISIT NOTE ? ?History:  ? Amy Elliott is a 21 y.o. G1P1001 here today for BP check and initiation of OCPs. Had postpartum hypertension after antepartum preeclampsia., was on Procardia and Dyazide. Not currently taking any any anti hypertensives. Currently on menstrual period. She denies any abnormal vaginal discharge, bleeding, pelvic pain or other concerns.  ?  ?Past Medical History:  ?Diagnosis Date  ? Anemia   ? ? ?Past Surgical History:  ?Procedure Laterality Date  ? CESAREAN SECTION  01/01/2021  ? Procedure: CESAREAN SECTION;  Surgeon: Federico Flake, MD;  Location: [facility];  Service: Obstetrics;;  ? WISDOM TOOTH EXTRACTION  2020  ? ? ?The following portions of the patient's history were reviewed and updated as appropriate: allergies, current medications, past family history, past medical history, past social history, past surgical history and problem list.  ? ?Health Maintenance:  ASCUS pap on 06/19/2020.    ? ?Review of Systems:  ?Pertinent items noted in HPI and remainder of comprehensive ROS otherwise negative. ? ?Physical Exam:  ?BP 123/77   Pulse (!) 54   Ht 5' 6.5" (1.689 m)   Wt 272 lb (123.4 kg)   LMP 03/26/2021   Breastfeeding No   BMI 43.24 kg/m?  ?CONSTITUTIONAL: Well-developed, well-nourished female in no acute distress.  ?HEENT:  Normocephalic, atraumatic. External right and left ear normal. No scleral icterus.  ?NECK: Normal range of motion, supple, no masses noted on observation ?SKIN: No rash noted. Not diaphoretic. No erythema. No pallor. ?MUSCULOSKELETAL: Normal range of motion. No edema noted. ?NEUROLOGIC: Alert and oriented to person, place, and time. Normal muscle tone coordination. No cranial nerve deficit noted. ?PSYCHIATRIC: Normal mood and affect. Normal behavior. Normal judgment and thought content. ?CARDIOVASCULAR: Normal heart rate noted ?RESPIRATORY: Effort and breath sounds normal, no problems with respiration noted ?ABDOMEN: No masses noted. No  other overt distention noted.   ?PELVIC: Deferred ?    ?Assessment and Plan:  ?   ?1. Initiation of OCP (BCP) ?2. BP check ?Stable BP. On menstrual period currently. Same day start recommended for Yaz. Cautioned about side effects to be concerned about, also emphasized importance of taking at same time daily. Safe sex practices recommended. Of note, recommended LARCs (IUD or Nexplanon) as more effective modalities, patient declined this.  Return in 4 weeks for BP check. ?- drospirenone-ethinyl estradiol (YAZ) 3-0.02 MG tablet; Take 1 tablet by mouth daily.  Dispense: 28 tablet; Refill: 11 ?Routine preventative health maintenance measures emphasized, needs pap in 06/2021. ?Please refer to After Visit Summary for other counseling recommendations.  ? ?Return in about 4 weeks (around 04/29/2021) for BP check (RN visit).   ? ?I spent 20 minutes dedicated to the care of this patient including pre-visit review of records, face to face time with the patient discussing her conditions and treatments and post visit orders. ? ? ? ?Jaynie Collins, MD, FACOG ?Obstetrician Heritage manager, Faculty Practice ?Center for [employer], [facility] ? ? ? ? ? ? ?

## 2021-04-29 ENCOUNTER — Other Ambulatory Visit (HOSPITAL_COMMUNITY)
Admission: RE | Admit: 2021-04-29 | Discharge: 2021-04-29 | Disposition: A | Discharge status: Home / Self Care | Payer: Medicaid Other | Source: Ambulatory Visit | Attending: Obstetrics | Admitting: Obstetrics

## 2021-04-29 ENCOUNTER — Ambulatory Visit (INDEPENDENT_AMBULATORY_CARE_PROVIDER_SITE_OTHER): Payer: Medicaid Other

## 2021-04-29 VITALS — BP 135/85 | HR 59

## 2021-04-29 DIAGNOSIS — N898 Other specified noninflammatory disorders of vagina: Secondary | ICD-10-CM

## 2021-04-29 DIAGNOSIS — Z0130 Encounter for examination of blood pressure without abnormal findings: Secondary | ICD-10-CM

## 2021-04-29 NOTE — Progress Notes (Signed)
Agree with nurses's documentation of this patient's clinic encounter.  Danile Trier L, MD  

## 2021-04-29 NOTE — Progress Notes (Addendum)
..  Subjective:  ?Amy Elliott is a 21 y.o. female here for BP check.  ? ?Hypertension ROS: no TIA's, no chest pain on exertion, no dyspnea on exertion, and no swelling of ankles.  ? ? ?Objective:  ?BP 135/85   Pulse (!) 59   ?Appearance alert, well appearing, and in no distress. ?General exam BP noted to be well controlled today in office.  ? ? ?Assessment:   ?Blood Pressure stable.  ? ?Plan:  ?Pt is currently taking BC pills, BP within normal range today. Advised to follow up with a PCP to continue to monitor BP since there was an increase since last visit. Provided patient with primary care referral list  . ? ?Pt also reports vaginal discharge, performed self swab today for STD testing. ? ?

## 2021-04-30 ENCOUNTER — Encounter: Payer: Self-pay | Admitting: Obstetrics & Gynecology

## 2021-04-30 LAB — CERVICOVAGINAL ANCILLARY ONLY
Bacterial Vaginitis (gardnerella): NEGATIVE
Candida Glabrata: NEGATIVE
Candida Vaginitis: NEGATIVE
Chlamydia: NEGATIVE
Comment: NEGATIVE
Comment: NEGATIVE
Comment: NEGATIVE
Comment: NEGATIVE
Comment: NEGATIVE
Comment: NORMAL
Neisseria Gonorrhea: NEGATIVE
Trichomonas: NEGATIVE

## 2021-08-24 ENCOUNTER — Ambulatory Visit: Payer: Medicaid Other | Admitting: Nurse Practitioner

## 2022-06-29 IMAGING — US US MFM OB DETAIL+14 WK
1 series · 13 of 28 positions shown · non-contrast
Comparison: none

[Series 1: us mfm ob detail+14 wk · 132 acquisitions, 13 frames shown]
[im 5/132]
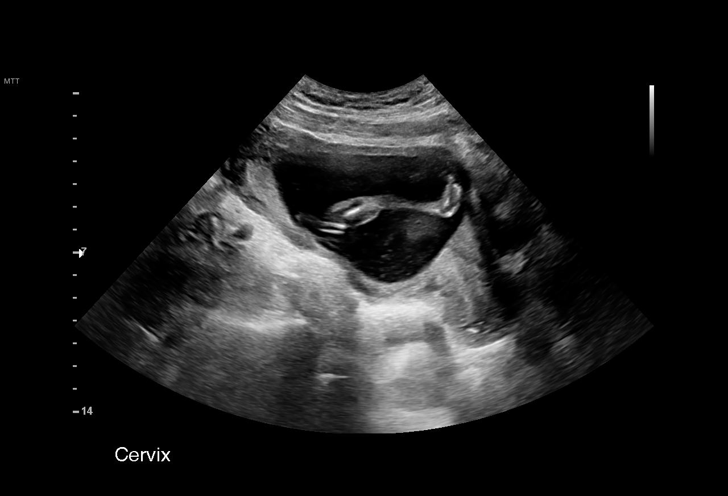
[im 15/132]
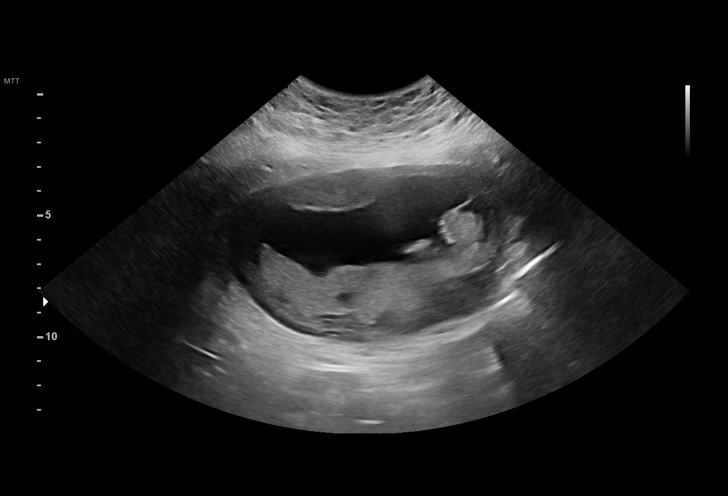
[im 25/132]
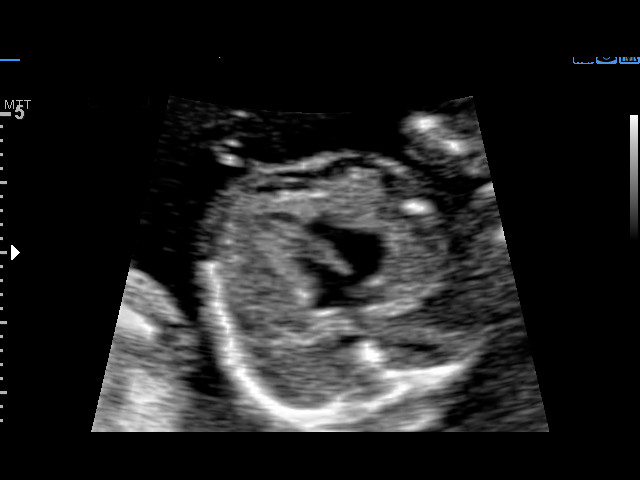
[im 34/132]
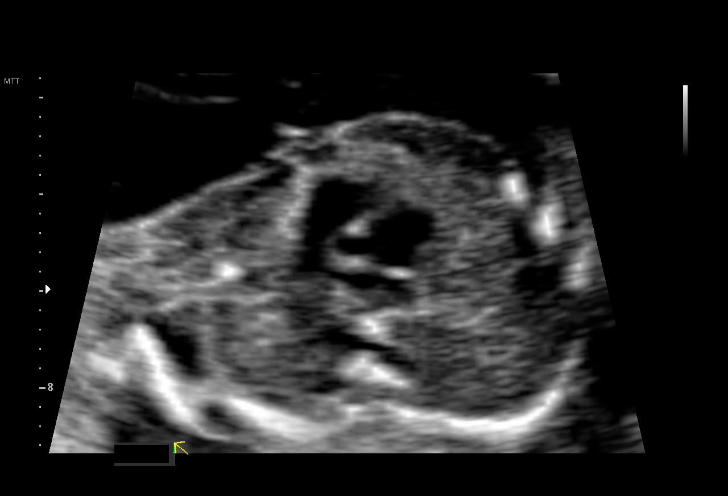
[im 44/132]
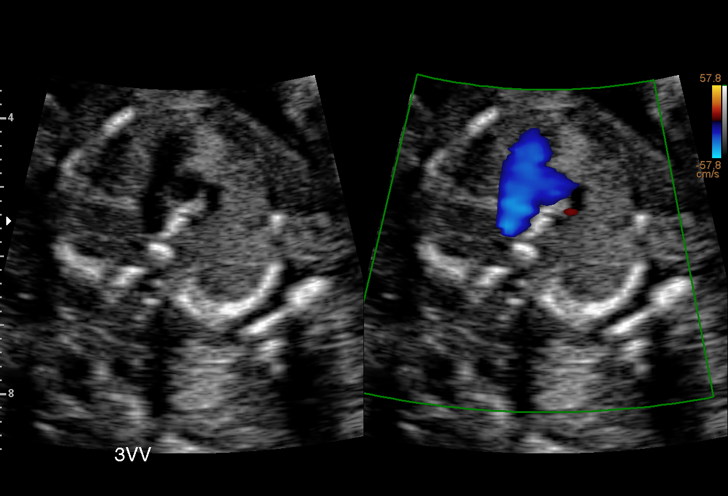
[im 54/132]
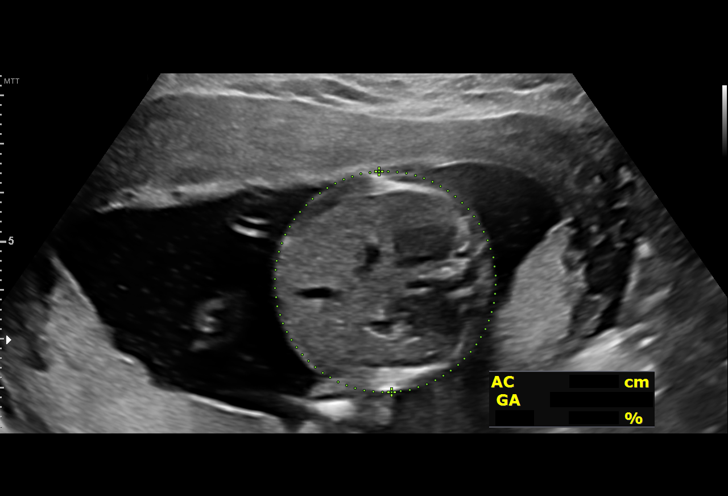
[im 68/132]
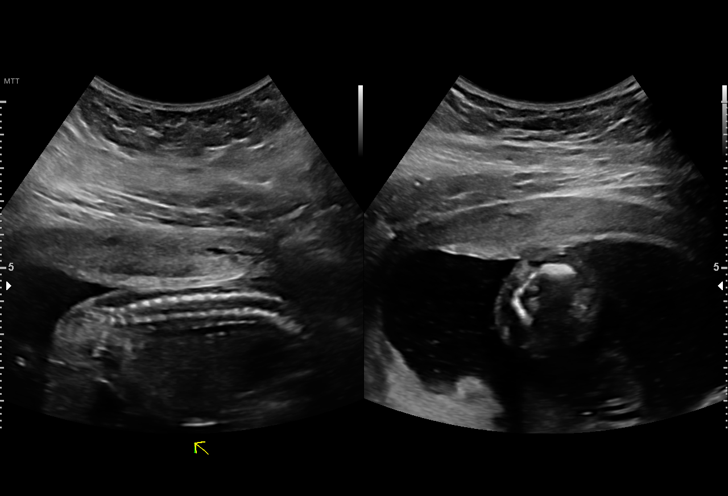
[im 78/132]
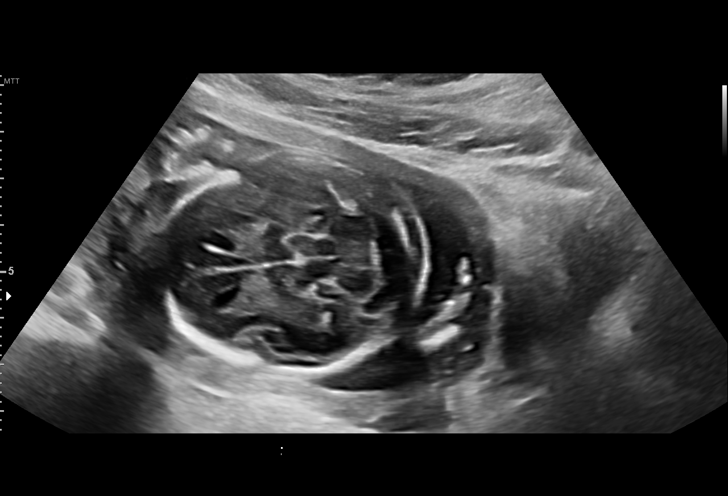
[im 88/132]
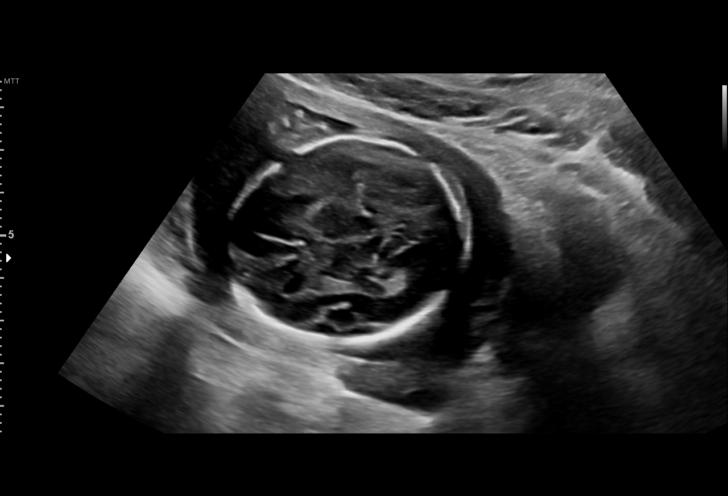
[im 98/132]
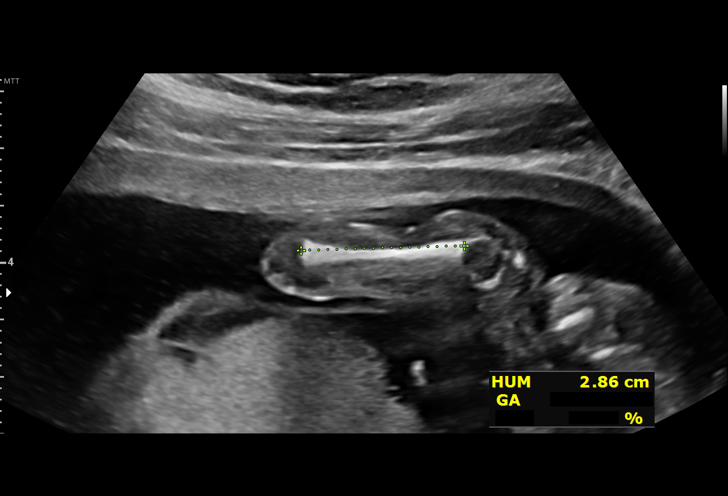
[im 107/132]
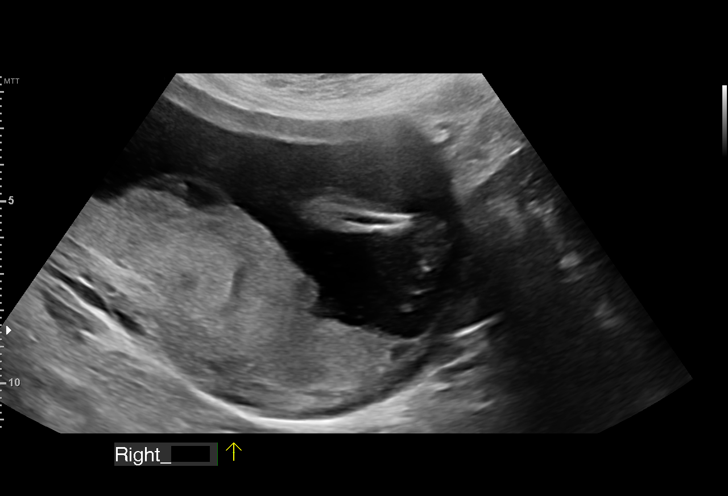
[im 117/132]
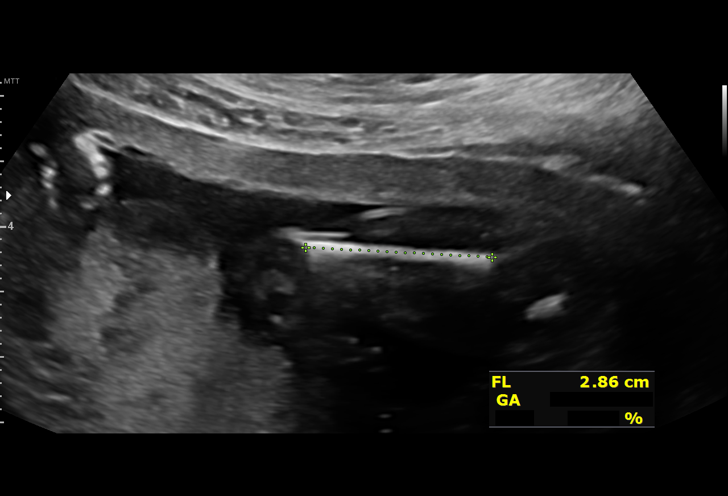
[im 127/132]
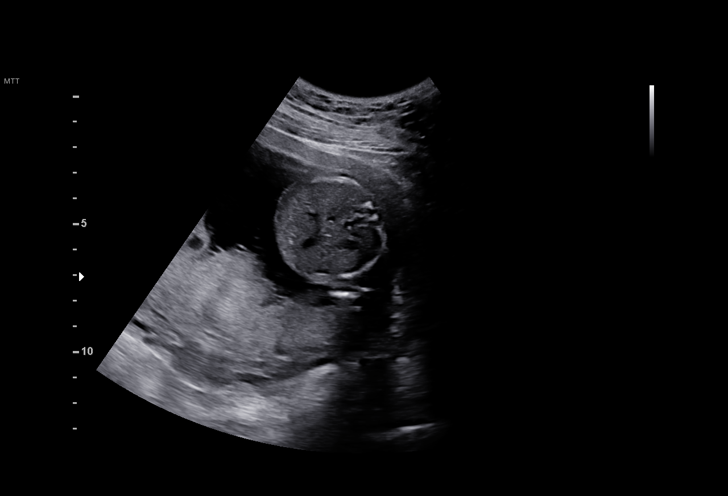

[13 of 28 positions shown; findings below may reference images not displayed]

[id number]

Indications

 Obesity complicating pregnancy, second
 trimester (BMI 35)
 Medical complication of pregnancy (Possible
 history of Chronic Hypertension)
 19 weeks gestation of pregnancy
 Encounter for antenatal screening for
 malformations
 Other mental disorder complicating
 pregnancy, second trimester
 Anemia during pregnancy in second trimester
Fetal Evaluation

 Num Of Fetuses:         1
 Fetal Heart Rate(bpm):  147
 Cardiac Activity:       Observed
 Presentation:           Cephalic
 Placenta:               Posterior
 P. Cord Insertion:      Visualized, central

 Amniotic Fluid
 AFI FV:      Within normal limits

                             Largest Pocket(cm)

Biometry

 BPD:      47.9  mm     G. Age:  20w 3d         93  %    CI:        83.47   %    70 - 86
                                                         FL/HC:      17.5   %    16.1 -
 HC:      165.3  mm     G. Age:  19w 2d         47  %    HC/AC:      1.17        1.09 -
 AC:      141.8  mm     G. Age:  19w 4d         59  %    FL/BPD:     60.3   %
 FL:       28.9  mm     G. Age:  18w 6d         33  %    FL/AC:      20.4   %    20 - 24
 HUM:      27.9  mm     G. Age:  19w 0d         46  %
 CER:      19.7  mm     G. Age:  19w 1d         40  %
 NFT:       4.0  mm
 LV:        7.1  mm
 CM:        6.1  mm

 Est. FW:     284  gm    0 lb 10 oz      54  %
OB History

 Blood Type:   A+
 Gravidity:    1         Term:   0        Prem:   0        SAB:   0
 TOP:          0       Ectopic:  0        Living: 0
Gestational Age

 LMP:           19w 2d        Date:  03/23/20                 EDD:   12/28/20
 U/S Today:     19w 4d                                        EDD:   12/26/20
 Best:          19w 1d     Det. By:  U/S C R L  (06/06/20)    EDD:   12/29/20
Anatomy

 Cranium:               Appears normal         LVOT:                   Appears normal
 Cavum:                 Not well visualized    Aortic Arch:            Appears normal
 Ventricles:            Appears normal         Ductal Arch:            Appears normal
 Choroid Plexus:        Appears normal         Diaphragm:              Appears normal
 Cerebellum:            Appears normal         Stomach:                Appears normal, left
                                                                       sided
 Posterior Fossa:       Appears normal         Abdomen:                Appears normal
 Nuchal Fold:           Appears normal         Abdominal Wall:         Appears nml (cord
                                                                       insert, abd wall)
 Face:                  Appears normal         Cord Vessels:           Appears normal (3
                        (orbits and profile)                           vessel cord)
 Lips:                  Appears normal         Kidneys:                Appear normal
 Palate:                Not well visualized    Bladder:                Appears normal
 Thoracic:              Appears normal         Spine:                  Appears normal
 Heart:                 Appears normal         Upper Extremities:      Appears normal
                        (4CH, axis, and
                        situs)
 RVOT:                  Appears normal         Lower Extremities:      Appears normal

 Other:  Fetus appears to be female. Lenses, Nasal Bone,  Heels/feet and
         Right open hand visualized. VC, 3VV and 3VTV visualized.
         Technically difficult due to maternal habitus and fetal position.
Cervix Uterus Adnexa

 Cervix
 Length:           3.86  cm.
 Normal appearance by transabdominal scan.
 Uterus
 No abnormality visualized.

 Right Ovary
 Within normal limits.

 Left Ovary
 Within normal limits.

 Cul De Sac
 No free fluid seen.

 Adnexa
 No abnormality visualized.
Comments

 This patient was seen for a detailed fetal anatomy scan due
 to maternal obesity with a BMI of 35.4.
 She denies any significant past medical history and denies
 any problems in her current pregnancy.
 She had a cell free DNA test earlier in her pregnancy which
 indicated a low risk for trisomy 21, 18, and 13. A female fetus
 is predicted.
 She was informed that the fetal growth and amniotic fluid
 level were appropriate for her gestational age.
 There were no obvious fetal anomalies noted on today's
 ultrasound exam.
 The patient was informed that anomalies may be missed due
 to technical limitations. If the fetus is in a suboptimal position
 or maternal habitus is increased, visualization of the fetus in
 the maternal uterus may be impaired.
 A follow-up exam was scheduled in 5 weeks to assess the
 fetal growth due to maternal obesity.

## 2022-07-20 ENCOUNTER — Ambulatory Visit (INDEPENDENT_AMBULATORY_CARE_PROVIDER_SITE_OTHER): Payer: Medicaid Other | Admitting: Certified Nurse Midwife

## 2022-07-20 ENCOUNTER — Other Ambulatory Visit (HOSPITAL_COMMUNITY)
Admission: RE | Admit: 2022-07-20 | Discharge: 2022-07-20 | Disposition: A | Discharge status: Home / Self Care | Payer: Medicaid Other | Source: Ambulatory Visit | Attending: Certified Nurse Midwife | Admitting: Certified Nurse Midwife

## 2022-07-20 ENCOUNTER — Encounter: Payer: Self-pay | Admitting: Certified Nurse Midwife

## 2022-07-20 VITALS — BP 130/84 | HR 59 | Ht 66.5 in | Wt 276.9 lb

## 2022-07-20 DIAGNOSIS — Z01419 Encounter for gynecological examination (general) (routine) without abnormal findings: Secondary | ICD-10-CM

## 2022-07-20 DIAGNOSIS — Z3009 Encounter for other general counseling and advice on contraception: Secondary | ICD-10-CM | POA: Diagnosis not present

## 2022-07-20 DIAGNOSIS — Z113 Encounter for screening for infections with a predominantly sexual mode of transmission: Secondary | ICD-10-CM | POA: Insufficient documentation

## 2022-07-20 MED ORDER — NORGESTIMATE-ETH ESTRADIOL 0.25-35 MG-MCG PO TABS: 1.0000 | ORAL_TABLET | Freq: Every day | ORAL | 11 refills | Status: DC

## 2022-07-20 NOTE — Progress Notes (Signed)
Pt presents [facility] consult. Pt unsure of BC method.

## 2022-07-20 NOTE — Progress Notes (Signed)
GYNECOLOGY OFFICE VISIT NOTE  History:   Amy Elliott is a 22 y.o. G1P1001 here today for Well woman exam with birth control counseling. She denies any abnormal vaginal discharge, bleeding, pelvic pain or other concerns.  She reports normal 21-25 day cycles with a 4-5 day bleed. She states that her heavier days are days 2-3 and reports using 4-5 tampons per day. Denies painful periods, headaches, lightheadedness or dizziness during cycles. She request STD testing and wants information on birth control options.     Past Medical History:  Diagnosis Date   Anemia     Past Surgical History:  Procedure Laterality Date   CESAREAN SECTION  01/01/2021   Procedure: CESAREAN SECTION;  Surgeon: Federico Flake, MD;  Location: [facility];  Service: Obstetrics;;   WISDOM TOOTH EXTRACTION  2020    The following portions of the patient's history were reviewed and updated as appropriate: allergies, current medications, past family history, past medical history, past social history, past surgical history and problem list.   Health Maintenance:  Abnormal pap and negative HRHPV on 06/19/20 at age 41.   Review of Systems:  Pertinent items noted in HPI and remainder of comprehensive ROS otherwise negative.  Physical Exam:  BP 130/84   Pulse (!) 59   Ht 5' 6.5" (1.689 m)   Wt 276 lb 14.4 oz (125.6 kg)   LMP 07/08/2022   BMI 44.02 kg/m  CONSTITUTIONAL: Well-developed, well-nourished female in no acute distress.  HEENT:  Normocephalic, atraumatic. External right and left ear normal. No scleral icterus.  NECK: Normal range of motion, supple, no masses noted on observation SKIN: No rash noted. Not diaphoretic. No erythema. No pallor. MUSCULOSKELETAL: Normal range of motion. No edema noted. NEUROLOGIC: Alert and oriented to person, place, and time. Normal muscle tone coordination. No cranial nerve deficit noted. PSYCHIATRIC: Normal mood and affect. Normal behavior. Normal judgment and thought  content. CARDIOVASCULAR: Normal heart rate noted RESPIRATORY: Effort and breath sounds normal, no problems with respiration noted ABDOMEN: No masses noted. No other overt distention noted.   PELVIC: Deferred  Labs and Imaging No results found for this or any previous visit (from the past 168 hour(s)). No results found.    Assessment and Plan:    1. Well woman exam - Patient overall doing well.  - Reports that she has recently become sexually active and has no desires to get pregnant at this time   2. Screening examination for STD (sexually transmitted disease) - STD testing completed today in office  - HIV antibody (with reflex) - RPR - Hepatitis B Surface AntiGEN - Hepatitis C Antibody - Cervicovaginal ancillary only( [place])  3. Birth control counseling - Reviewed all methods briefly. Patient interested in Pills, [place] and IUD. After further discussion on methods for entry, patient opted for Pills. Reports that she previously did well with pills following the birth of her daughter. Reviewed both risks and benefits of Pills and patient still desires pill.  - CNM stressed the importance of taking them as prescribed. Admin instructions provided at bedside.  - Rx for COCPs sent to outpatient pharmacy for pick up.    Routine preventative health maintenance measures emphasized. Please refer to After Visit Summary for other counseling recommendations.   Return in about 6 months (around 01/20/2023) for Birth control follow up .    I spent 30 minutes dedicated to the care of this patient including pre-visit review of records, face to face time with the patient discussing  her conditions and treatments and post visit orders.    Amy Elliott) Suzie Portela, MSN, CNM  Center for [facility]  07/20/22 4:50 PM

## 2022-07-21 LAB — CERVICOVAGINAL ANCILLARY ONLY
Bacterial Vaginitis (gardnerella): POSITIVE — AB
Candida Glabrata: NEGATIVE
Candida Vaginitis: NEGATIVE
Chlamydia: NEGATIVE
Comment: NEGATIVE
Comment: NEGATIVE
Comment: NEGATIVE
Comment: NEGATIVE
Comment: NEGATIVE
Comment: NORMAL
Neisseria Gonorrhea: NEGATIVE
Trichomonas: NEGATIVE

## 2022-07-21 LAB — RPR: RPR Ser Ql: NONREACTIVE

## 2022-07-21 LAB — HEPATITIS B SURFACE ANTIGEN: Hepatitis B Surface Ag: NEGATIVE

## 2022-07-21 LAB — HIV ANTIBODY (ROUTINE TESTING W REFLEX): HIV Screen 4th Generation wRfx: NONREACTIVE

## 2022-07-21 LAB — HEPATITIS C ANTIBODY: Hep C Virus Ab: NONREACTIVE

## 2022-07-22 ENCOUNTER — Telehealth: Payer: Self-pay | Admitting: Emergency Medicine

## 2022-07-22 MED ORDER — METRONIDAZOLE 500 MG PO TABS: 500.0000 mg | ORAL_TABLET | Freq: Two times a day (BID) | ORAL | 0 refills | Status: DC

## 2022-07-22 NOTE — Telephone Encounter (Signed)
Pt informed of results, Rx.   

## 2022-07-22 NOTE — Addendum Note (Signed)
Addended by: Carlynn Herald on: 07/22/2022 02:20 PM   Modules accepted: Orders

## 2022-08-06 ENCOUNTER — Ambulatory Visit (INDEPENDENT_AMBULATORY_CARE_PROVIDER_SITE_OTHER): Payer: Medicaid Other | Admitting: Family Medicine

## 2022-08-06 ENCOUNTER — Encounter: Payer: Self-pay | Admitting: Family Medicine

## 2022-08-06 VITALS — BP 124/80 | HR 74 | Temp 98.0°F | Ht 66.0 in | Wt 267.0 lb

## 2022-08-06 DIAGNOSIS — Z7689 Persons encountering health services in other specified circumstances: Secondary | ICD-10-CM

## 2022-08-06 DIAGNOSIS — Z6841 Body Mass Index (BMI) 40.0 and over, adult: Secondary | ICD-10-CM

## 2022-08-06 DIAGNOSIS — J302 Other seasonal allergic rhinitis: Secondary | ICD-10-CM | POA: Diagnosis not present

## 2022-08-06 DIAGNOSIS — N926 Irregular menstruation, unspecified: Secondary | ICD-10-CM

## 2022-08-06 DIAGNOSIS — Z113 Encounter for screening for infections with a predominantly sexual mode of transmission: Secondary | ICD-10-CM

## 2022-08-06 DIAGNOSIS — R062 Wheezing: Secondary | ICD-10-CM | POA: Diagnosis not present

## 2022-08-06 DIAGNOSIS — R079 Chest pain, unspecified: Secondary | ICD-10-CM

## 2022-08-06 DIAGNOSIS — Z0001 Encounter for general adult medical examination with abnormal findings: Secondary | ICD-10-CM | POA: Diagnosis not present

## 2022-08-06 MED ORDER — CETIRIZINE HCL 10 MG PO TABS: 10.0000 mg | ORAL_TABLET | Freq: Every day | ORAL | 2 refills | Status: DC

## 2022-08-06 MED ORDER — ALBUTEROL SULFATE (2.5 MG/3ML) 0.083% IN NEBU: 2.5000 mg | INHALATION_SOLUTION | RESPIRATORY_TRACT | 2 refills | Status: AC | PRN

## 2022-08-06 NOTE — Progress Notes (Unsigned)
Madelaine Bhat, CMA,acting as a Neurosurgeon for [employer], NP.,have documented all relevant documentation on the behalf of Kyanna Mahrt, NP,as directed by  Tayquan Gassman Moshe Salisbury, NP while in the presence of Ceclia Koker [facility], NP.  Subjective:  Patient ID: Amy Elliott , female    DOB: [birth year] , 22 y.o.   MRN: [id number]  No chief complaint on file.   HPI  Patient presents today to establish care, patient would like to discuss her health. Patient reports she has like a pinching feeling in her chest almost everyday, she reports the feeling last for about 5-10 minutes. Patient reports it first started about a year ago.Patient also states her mum has heart disease so she would like to be be checked out.Patient states she was diagnosed with anxiety and depression as a teenager, treated with medication, counseling and is doing fine now. Patient is established with [facility]. Patient reports she had a fall about 2 months ago and has been bend her knee all the way since. Patient reports her period was supposed to start about 2 days ago,  she is only having spotting.  BP Readings from Last 3 Encounters: 08/06/22 : 124/80 07/20/22 : 130/84 04/29/21 : 135/85       Past Medical History:  Diagnosis Date   Anemia      Family History  Problem Relation Age of Onset   Hypertension Mother    Varicose Veins Father    Diabetes Maternal Grandfather      Current Outpatient Medications:    drospirenone-ethinyl estradiol (YAZ) 3-0.02 MG tablet, Take 1 tablet by mouth daily. (Patient not taking: Reported on 07/20/2022), Disp: 28 tablet, Rfl: 11   ferrous sulfate 325 (65 FE) MG tablet, Take 1 tablet (325 mg total) by mouth every other day., Disp: 30 tablet, Rfl: 0   metroNIDAZOLE (FLAGYL) 500 MG tablet, Take 1 tablet (500 mg total) by mouth 2 (two) times daily., Disp: 14 tablet, Rfl: 0   metroNIDAZOLE (FLAGYL) 500 MG tablet, Take 1 tablet (500 mg total) by mouth 2 (two) times daily., Disp: 14 tablet, Rfl: 0    norgestimate-ethinyl estradiol (ORTHO-CYCLEN) 0.25-35 MG-MCG tablet, Take 1 tablet by mouth daily., Disp: 28 tablet, Rfl: 11   Prenat-Fe Poly-Methfol-FA-DHA (VITAFOL ULTRA) 29-0.6-0.4-200 MG CAPS, Take 1 tablet by mouth daily. (Patient not taking: Reported on 04/29/2021), Disp: 90 capsule, Rfl: 3   Allergies  Allergen Reactions   Banana    Other Itching    Grapes with seeds     Review of Systems  Constitutional: Negative.   HENT:  Positive for congestion and postnasal drip.   Eyes: Negative.   Genitourinary: Negative.   Skin: Negative.   Allergic/Immunologic: Positive for environmental allergies.  Neurological: Negative.   Psychiatric/Behavioral: Negative.       There were no vitals filed for this visit. There is no height or weight on file to calculate BMI.  Wt Readings from Last 3 Encounters:  07/20/22 276 lb 14.4 oz (125.6 kg)  04/01/21 272 lb (123.4 kg)  02/13/21 263 lb 9.6 oz (119.6 kg)    The ASCVD Risk score (Arnett DK, et al., 2019) failed to calculate for the following reasons:   The 2019 ASCVD risk score is only valid for ages 67 to 67  Objective:  Physical Exam Abdominal:     General: Abdomen is flat. Bowel sounds are normal.  Skin:    General: Skin is warm and dry.  Neurological:     Mental Status: She is alert and oriented  to person, place, and time.  Psychiatric:        Mood and Affect: Mood normal.        Behavior: Behavior normal.         Assessment And Plan:  Establishing care with new doctor, encounter for  Morbid obesity (HCC)  Generalized anxiety disorder  Moderate episode of recurrent major depressive disorder (HCC)  Sleep difficulties    No follow-ups on file.  Patient was given opportunity to ask questions. Patient verbalized understanding of the plan and was able to repeat key elements of the plan. All questions were answered to their satisfaction.    I, Venora Kautzman, NP, have reviewed all documentation for this visit. The  documentation on 08/06/22 for the exam, diagnosis, procedures, and orders are all accurate and complete.   IF YOU HAVE BEEN REFERRED TO A SPECIALIST, IT MAY TAKE 1-2 WEEKS TO SCHEDULE/PROCESS THE REFERRAL. IF YOU HAVE NOT HEARD FROM US/SPECIALIST IN TWO WEEKS, PLEASE GIVE [place] A CALL AT [phone].

## 2022-08-11 DIAGNOSIS — J302 Other seasonal allergic rhinitis: Secondary | ICD-10-CM | POA: Insufficient documentation

## 2022-08-11 DIAGNOSIS — R062 Wheezing: Secondary | ICD-10-CM | POA: Insufficient documentation

## 2022-08-11 DIAGNOSIS — Z113 Encounter for screening for infections with a predominantly sexual mode of transmission: Secondary | ICD-10-CM | POA: Insufficient documentation

## 2022-08-11 DIAGNOSIS — N926 Irregular menstruation, unspecified: Secondary | ICD-10-CM | POA: Insufficient documentation

## 2022-08-11 DIAGNOSIS — R079 Chest pain, unspecified: Secondary | ICD-10-CM | POA: Insufficient documentation

## 2022-08-24 ENCOUNTER — Ambulatory Visit: Payer: Medicaid Other | Admitting: Nurse Practitioner

## 2022-09-03 ENCOUNTER — Ambulatory Visit: Payer: Medicaid Other | Admitting: Physical Therapy

## 2022-09-30 ENCOUNTER — Ambulatory Visit: Payer: Medicaid Other | Admitting: Physical Therapy

## 2022-10-14 ENCOUNTER — Ambulatory Visit: Payer: Medicaid Other | Attending: Orthopedic Surgery | Admitting: Physical Therapy

## 2022-10-14 DIAGNOSIS — M25512 Pain in left shoulder: Secondary | ICD-10-CM | POA: Insufficient documentation

## 2022-10-14 DIAGNOSIS — M25552 Pain in left hip: Secondary | ICD-10-CM | POA: Insufficient documentation

## 2022-10-14 DIAGNOSIS — M5459 Other low back pain: Secondary | ICD-10-CM | POA: Insufficient documentation

## 2022-10-14 DIAGNOSIS — M6281 Muscle weakness (generalized): Secondary | ICD-10-CM | POA: Insufficient documentation

## 2022-10-14 DIAGNOSIS — M546 Pain in thoracic spine: Secondary | ICD-10-CM | POA: Insufficient documentation

## 2022-10-14 DIAGNOSIS — G8929 Other chronic pain: Secondary | ICD-10-CM | POA: Insufficient documentation

## 2022-10-26 ENCOUNTER — Ambulatory Visit: Payer: Medicaid Other | Admitting: Physical Therapy

## 2022-10-26 DIAGNOSIS — M546 Pain in thoracic spine: Secondary | ICD-10-CM

## 2022-10-26 DIAGNOSIS — M6281 Muscle weakness (generalized): Secondary | ICD-10-CM

## 2022-10-26 DIAGNOSIS — M5459 Other low back pain: Secondary | ICD-10-CM

## 2022-10-26 DIAGNOSIS — M25552 Pain in left hip: Secondary | ICD-10-CM | POA: Diagnosis present

## 2022-10-26 DIAGNOSIS — G8929 Other chronic pain: Secondary | ICD-10-CM | POA: Diagnosis present

## 2022-10-26 DIAGNOSIS — M25512 Pain in left shoulder: Secondary | ICD-10-CM | POA: Diagnosis present
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
## 2022-11-03 ENCOUNTER — Ambulatory Visit: Payer: Medicaid Other | Admitting: Nurse Practitioner

## 2022-11-04 ENCOUNTER — Encounter: Payer: Self-pay | Admitting: Physical Therapy

## 2022-11-04 ENCOUNTER — Ambulatory Visit: Payer: Medicaid Other | Admitting: Physical Therapy

## 2022-11-04 DIAGNOSIS — M6281 Muscle weakness (generalized): Secondary | ICD-10-CM

## 2022-11-04 DIAGNOSIS — M5459 Other low back pain: Secondary | ICD-10-CM

## 2022-11-04 DIAGNOSIS — G8929 Other chronic pain: Secondary | ICD-10-CM

## 2022-11-04 DIAGNOSIS — M25552 Pain in left hip: Secondary | ICD-10-CM

## 2022-11-04 DIAGNOSIS — M546 Pain in thoracic spine: Secondary | ICD-10-CM

## 2022-11-04 DIAGNOSIS — M25512 Pain in left shoulder: Secondary | ICD-10-CM | POA: Diagnosis not present

## 2022-11-04 NOTE — Patient Instructions (Signed)
Access Code: [email or web address] URL: [email or web address] Date: 11/04/2022 Prepared by: Camille Bal  Exercises - Single Arm Doorway Pec Stretch at 90 Degrees Abduction  - 1 x daily - 4 x weekly - 1 sets - 2-3 reps - 45 seconds hold - Single Arm Doorway Pec Stretch at 120 Degrees Abduction  - 1 x daily - 4 x weekly - 1 sets - 2-3 reps - 45 seconds hold - Supine Lower Trunk Rotation  - 1 x daily - 4 x weekly - 2 sets - 20 reps - Hooklying Single Knee to Chest Stretch with Towel  - 1 x daily - 4 x weekly - 1 sets - 2-3 reps - 45 seconds hold - Sit to Stand with Arms Crossed  - 1 x daily - 4 x weekly - 3 sets - 10 reps
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
## 2022-11-11 ENCOUNTER — Ambulatory Visit: Payer: Medicaid Other | Admitting: Physical Therapy

## 2022-11-18 ENCOUNTER — Telehealth: Payer: Self-pay | Admitting: Physical Therapy

## 2022-11-18 ENCOUNTER — Ambulatory Visit: Payer: Medicaid Other | Attending: Orthopedic Surgery | Admitting: Physical Therapy

## 2022-11-18 DIAGNOSIS — M546 Pain in thoracic spine: Secondary | ICD-10-CM | POA: Insufficient documentation

## 2022-11-18 DIAGNOSIS — M6281 Muscle weakness (generalized): Secondary | ICD-10-CM | POA: Insufficient documentation

## 2022-11-18 DIAGNOSIS — G8929 Other chronic pain: Secondary | ICD-10-CM | POA: Insufficient documentation

## 2022-11-18 DIAGNOSIS — M5459 Other low back pain: Secondary | ICD-10-CM | POA: Insufficient documentation

## 2022-11-18 DIAGNOSIS — M25512 Pain in left shoulder: Secondary | ICD-10-CM | POA: Insufficient documentation

## 2022-11-18 DIAGNOSIS — M25552 Pain in left hip: Secondary | ICD-10-CM | POA: Insufficient documentation

## 2022-11-18 NOTE — Addendum Note (Signed)
Addended by: Peter Congo on: 11/18/2022 09:38 AM   Modules accepted: Orders

## 2022-11-19 NOTE — Addendum Note (Signed)
Addended by: Peter Congo on: 11/19/2022 07:55 AM   Modules accepted: Orders

## 2022-11-25 ENCOUNTER — Ambulatory Visit: Payer: Medicaid Other | Admitting: Physical Therapy

## 2022-11-25 DIAGNOSIS — M25512 Pain in left shoulder: Secondary | ICD-10-CM | POA: Diagnosis present

## 2022-11-25 DIAGNOSIS — G8929 Other chronic pain: Secondary | ICD-10-CM | POA: Diagnosis present

## 2022-11-25 DIAGNOSIS — M25552 Pain in left hip: Secondary | ICD-10-CM | POA: Diagnosis present

## 2022-11-25 DIAGNOSIS — M5459 Other low back pain: Secondary | ICD-10-CM

## 2022-11-25 DIAGNOSIS — M546 Pain in thoracic spine: Secondary | ICD-10-CM | POA: Diagnosis present

## 2022-11-25 DIAGNOSIS — M6281 Muscle weakness (generalized): Secondary | ICD-10-CM

## 2022-11-25 NOTE — Therapy (Signed)
OUTPATIENT PHYSICAL THERAPY THORACOLUMBAR + SHOULDER TREATMENT - RECERTIFICATION   Patient Name: Amy Elliott MRN: [id number] DOB:[birth year], 22 y.o., female Today's Date: 11/26/2022    END OF SESSION:  PT End of Session - 11/25/22 1539     Visit Number 3    Number of Visits 11   recert   Date for PT Re-Evaluation 01/20/23   recert   Authorization Type [place] MEDICAID [employer]    PT Start Time 1538   pt arrived late   PT Stop Time 1616    PT Time Calculation (min) 38 min    Activity Tolerance Patient limited by pain    Behavior During Therapy [facility] for tasks assessed/performed              Past Medical History:  Diagnosis Date   Anemia    Depression 2018   Seizures (HCC) Dec 31-2022   Preeclampsia   Past Surgical History:  Procedure Laterality Date   CESAREAN SECTION  01/01/2021   Procedure: CESAREAN SECTION;  Surgeon: Federico Flake, MD;  Location: [facility] LD ORS;  Service: Obstetrics;;   WISDOM TOOTH EXTRACTION  2020   Patient Active Problem List   Diagnosis Date Noted   Chest pain 08/11/2022   Screening for STDs (sexually transmitted diseases) 08/11/2022   Menstrual period late 08/11/2022   Seasonal allergies 08/11/2022   Wheezing on auscultation 08/11/2022   Preeclampsia in postpartum period 01/10/2021   Postpartum care following cesarean delivery 01/06/2021   Gestational hypertension 12/30/2020   Atypical squamous cell changes of undetermined significance (ASCUS) on vaginal cytology 06/23/2020   Moderate episode of recurrent major depressive disorder (HCC) 04/07/2017   Generalized anxiety disorder 04/07/2017   Sleep difficulties 04/07/2017   Class 3 severe obesity due to excess calories with body mass index (BMI) of 40.0 to 44.9 in adult (HCC) 08/09/2016    PCP: None at this time  REFERRING PROVIDER: Billey Co, MD  REFERRING DIAG: Lumbar pain (M54.5) and thoracic spine pain (M54.6)  Rationale for Evaluation and Treatment:  Rehabilitation  THERAPY DIAG:  Pain in thoracic spine  Pain in left hip  Muscle weakness (generalized)  Other low back pain  Chronic left shoulder pain  ONSET DATE: 07/14/2022 (referral date)  SUBJECTIVE:                                                                                                                                                                                           SUBJECTIVE STATEMENT:   "[name]" "[name]"  Pt reports her pain has been "alright", states that it is not as bad as before. Pt reports she is doing her  HEP, really feels like she has to push it with her shoulder exercises. Pt rates her pain as 5/10 today, mostly feels pain in her L arm/shouder but occasionally in her back. Pt still unable to hold her daughter with her L arm.  PERTINENT HISTORY:  PMH: anxiety and depression, anemia, seizures  PAIN:  Are you having pain? Yes: NPRS scale: 5/10 Pain location: left arm Pain description: tingly, pinching Aggravating factors: holding daughter or other heavy objects Relieving factors: stretching, propping shoulder on pillow  PRECAUTIONS: None  RED FLAGS: Bowel or bladder incontinence: No   WEIGHT BEARING RESTRICTIONS: No  FALLS:  Has patient fallen in last 6 months? Yes. Number of falls 1 time "that was just me being clumsy" sometime in the summer  LIVING ENVIRONMENT: Lives with: lives with their family, mom Lives in: House/apartment Stairs: No Has following equipment at home: None  OCCUPATION: not right now, was working at [employer] prior to accident  PLOF: Independent  PATIENT GOALS: "be able to manage L side more", "hold my daughter", "lifting, moving, holding"  NEXT MD VISIT: none at this time  OBJECTIVE:   DIAGNOSTIC FINDINGS:  None found in this EMR  PATIENT SURVEYS:  Modified Oswestry 8/50, 16% disability  Quick Dash 41% disability   COGNITION: Overall cognitive status: Within functional limits for tasks  assessed     SENSATION: WFL, except left arm feels "mildly painful tingling"  UPPER EXTREMITY MMT:  MMT Right eval Left eval  Shoulder flexion 5 Painful in upper trap, 4-  Shoulder extension    Shoulder abduction 5 Painful in upper trap/deltoid, 4-  Shoulder adduction    Shoulder extension    Shoulder internal rotation    Shoulder external rotation    Middle trapezius    Lower trapezius    Elbow flexion 5 5  Elbow extension 5 5  Wrist flexion    Wrist extension    Wrist ulnar deviation    Wrist radial deviation    Wrist pronation    Wrist supination    Grip strength WFL WFL   (Blank rows = not tested)   UPPER EXTREMITY ROM:  Active ROM Right eval Left eval  Shoulder flexion WFL limited  Shoulder extension    Shoulder abduction  limited  Shoulder adduction    Shoulder extension    Shoulder internal rotation    Shoulder external rotation    Elbow flexion    Elbow extension    Wrist flexion    Wrist extension    Wrist ulnar deviation    Wrist radial deviation    Wrist pronation    Wrist supination     (Blank rows = not tested)   LUMBAR ROM:   AROM eval  Flexion Painful on L  Extension Painful on L  Right lateral flexion Painful on L, full  Left lateral flexion No pain, full  Right rotation Painful, full  Left rotation No pain, full   (Blank rows = not tested)  LOWER EXTREMITY ROM:     Active  Right eval Left eval  Hip flexion [facility]  Hip extension    Hip abduction    Hip adduction    Hip internal rotation    Hip external rotation    Knee flexion    Knee extension    Ankle dorsiflexion    Ankle plantarflexion    Ankle inversion    Ankle eversion     (Blank rows = not tested)  LOWER EXTREMITY MMT:  L  dorsiflexion resisted painful  MMT Right eval Left eval  Hip flexion 5 4+  Hip extension    Hip abduction 4 4  Hip adduction    Hip internal rotation    Hip external rotation    Knee flexion 5 5  Knee extension 5 5  Ankle  dorsiflexion 5 5, "little pinch"  Ankle plantarflexion    Ankle inversion    Ankle eversion     (Blank rows = not tested)   FUNCTIONAL TESTS:  5 times sit to stand: 29.78 seconds  TODAY'S TREATMENT:     TherAct For LTG assessment: QuickDASH: 52.3% disability Modified Oswestry: 7/50 (mild disability)   [facility] PT Assessment - 11/25/22 1629       Standardized Balance Assessment   Standardized Balance Assessment Five Times Sit to Stand    Five times sit to stand comments  17.43 sec   no UE           L shoulder assessment: PROM L shoulder flexion and abduction limited to about 90 degrees by muscle guarding IR/ER slightly limited due to pain and muscle guarding   SHOULDER SPECIAL TESTS: Impingement tests: Neer impingement test: positive  and Hawkins/Kennedy impingement test: positive   Rotator cuff assessment: Empty can test: positive  and Full can test: positive   Educated patient on use of heating pad or topical muscle soreness cream to address pain following stretching. Also encouraged pt to find a PCP and ask them to order imaging or refer them to an orthopedic specialist regarding her ongoing L shoulder pain.   TherEx Supine shoulder flexion with dowel rod x 10 reps, limited to about 90 degrees Supine shoulder abduction with dowel x 10 reps, limited to about 90 degrees Seated shoulder ER with dowel x 10 reps  Added to HEP, see bolded below. Encouraged patient to focus on these stretches before next session as her L shoulder pain and limited mobility seems to be the biggest concern currently. Educated patient that she needs to increase her shoulder ROM before working on strengthening. Pt indicated that she might try lifting her daughter overhead to work on Print production planner, educated her that she needs to work on ROM first.  PATIENT EDUCATION:  Education details: continue HEP and added to HEP, PT POC (will schedule one appt at at time), see above Person educated:  Patient Education method: Explanation, Demonstration, Tactile cues, Verbal cues, and Handouts Education comprehension: verbalized understanding, returned demonstration, and needs further education  HOME EXERCISE PROGRAM:  Access Code: [email or web address] URL: [email or web address] Date: 11/04/2022 Prepared by: Camille Bal  Exercises - Single Arm Doorway Pec Stretch at 90 Degrees Abduction  - 1 x daily - 4 x weekly - 1 sets - 2-3 reps - 45 seconds hold - Single Arm Doorway Pec Stretch at 120 Degrees Abduction  - 1 x daily - 4 x weekly - 1 sets - 2-3 reps - 45 seconds hold - Supine Lower Trunk Rotation  - 1 x daily - 4 x weekly - 2 sets - 20 reps - Hooklying Single Knee to Chest Stretch with Towel  - 1 x daily - 4 x weekly - 1 sets - 2-3 reps - 45 seconds hold - Sit to Stand with Arms Crossed  - 1 x daily - 4 x weekly - 3 sets - 10 reps - Supine Shoulder Flexion with Dowel  - 1 x daily - 7 x weekly - 3 sets - 10 reps - Seated Shoulder External Rotation AAROM with Dowel  -  1 x daily - 7 x weekly - 3 sets - 10 reps - Supine Shoulder Abduction AAROM with Dowel  - 1 x daily - 7 x weekly - 3 sets - 10 reps   ASSESSMENT:  CLINICAL IMPRESSION:  Emphasis of skilled PT session on assessing LTG and writing new LTG for recertification of PT services this date. Pt has met 1/4 LTG assessed this date, is making progress towards 2/4 LTG, and did not meet 1/4 LTG. Pt demonstrates decreased disability level regarding her back pain, improved functional LE strength, and good understanding of her initial HEP. She remains limited by ongoing L shoulder pain and decreased function with this limb. Pt continues to benefit from skilled therapy services to work towards improved management of her pain symptoms and improved function of her LUE. Pt's progress has been limited so far by fair attendance at PT sessions with several missed visits. Encouraged pt to consistently attend PT session as well as work on her  HEP in order to see progress. Also encouraged pt to find a PCP and possibly obtain a referral to an orthopedic specialist regarding her ongoing L shoulder pain. Continue POC.    OBJECTIVE IMPAIRMENTS: decreased endurance, decreased mobility, decreased ROM, decreased strength, hypomobility, increased muscle spasms, impaired flexibility, and pain.   ACTIVITY LIMITATIONS: carrying, lifting, bending, reach over head, and caring for others  PARTICIPATION LIMITATIONS: interpersonal relationship and occupation  PERSONAL FACTORS: Time since onset of injury/illness/exacerbation and 1-2 comorbidities:    anxiety, depression, anemia, seizures, are also affecting patient's functional outcome.   REHAB POTENTIAL: Fair nearly one year since accident, pt is young and motivated  CLINICAL DECISION MAKING: Stable/uncomplicated  EVALUATION COMPLEXITY: Low   GOALS: Goals reviewed with patient? Yes  SHORT TERM GOALS= LONG TERM GOALS DUE TO LENGTH OF POC:   LONG TERM GOALS: Target date: 11/23/2022  Pt will improve Modified Oswestry to 6% (MCID 10%) to demonstrate improving back pain and tolerance to ADLs and IADLs.  Baseline: (10/15) 16%, 14% disability (11/4) Goal status: IN PROGRESS  2.  Pt will improve Quick Dash to 31% (MCID 10 points) to demonstrate improving shoulder pain to allow increased function and patient to return to lifting, carrying objects, and holding her daughter. Baseline: (10/15) 41% disability, 52.3% disability (11/14) Goal status: NOT MET  3.  Pt will be independent with final HEP for improved strength, balance, transfers and gait.  Baseline: none Goal status: IN PROGRESS  4.  Pt will improve 5x STS to 24 seconds or less to demonstrate improving BLE strength, endurance, and functional mobility. Baseline: (10/15) 29.78 seconds, 17.43 sec no UE (11/4)  Goal status: MET   NEW SHORT TERM GOALS:   Target date: 12/23/2022  Pt will improve Quick Dash to 40 % to demonstrate  improving shoulder pain to allow increased function and patient to return to lifting, carrying objects, and holding her daughter. Baseline: (10/15) 41% disability, 52.3% disability (11/14) Goal status: NOT MET   NEW LONG TERM GOALS:  Target date: 01/20/2023  Pt will improve Quick Dash to 30 % to demonstrate improving shoulder pain to allow increased function and patient to return to lifting, carrying objects, and holding her daughter. Baseline: (10/15) 41% disability, 52.3% disability (11/14) Goal status: NOT MET  2.  Pt will be independent with final HEP for improved strength, ROM, function, and ability to complete ADLs with minimal increase in pain. Baseline: initial HEP initiated Goal status: IN PROGRESS    PLAN:  PT FREQUENCY: 1x/week  PT  DURATION: 4 weeks + 8 weeks (recert)  PLANNED INTERVENTIONS: 16109- PT Re-evaluation, 97110-Therapeutic exercises, 97530- Therapeutic activity, [id number]- Neuromuscular re-education, 97535- Self Care, [id number]- Manual therapy, [phone]- Gait training, 97014- Electrical stimulation (unattended), [phone]- Electrical stimulation (manual), [id number]- Traction (mechanical), Patient/Family education, Balance training, Stair training, Taping, Dry Needling, Joint mobilization, Joint manipulation, Spinal manipulation, Spinal mobilization, Cryotherapy, Moist heat, Therapeutic exercises, Therapeutic activity, Neuromuscular re-education, Gait training, and Self Care.  PLAN FOR NEXT SESSION: how is L shoulder HEP? Gradual increase in L shoulder strengthening pending joint ROM, did pt find a PCP? scapular rhythm exercises-wall slides or clocks, lumbar rollouts, lifting training, sit to stands, prone?, palloff press, lifts/chops, tens/heat for pain? Pain education No blaze pods, PMH of seizures, can schedule one appointment at at time   For all possible CPT codes, reference the Planned Interventions line above.     Check all conditions that are expected to impact treatment:  {Conditions expected to impact treatment:Neurological condition and/or seizures   If treatment provided at initial evaluation, no treatment charged due to lack of authorization.         Peter Congo, PT, DPT, CSRS     11/26/2022, 8:14 AM

## 2022-11-30 ENCOUNTER — Ambulatory Visit: Payer: Medicaid Other | Admitting: Physical Therapy

## 2022-11-30 DIAGNOSIS — G8929 Other chronic pain: Secondary | ICD-10-CM

## 2022-11-30 DIAGNOSIS — M546 Pain in thoracic spine: Secondary | ICD-10-CM

## 2022-11-30 DIAGNOSIS — M6281 Muscle weakness (generalized): Secondary | ICD-10-CM

## 2022-11-30 NOTE — Therapy (Signed)
OUTPATIENT PHYSICAL THERAPY THORACOLUMBAR + SHOULDER TREATMENT   Patient Name: Amy Elliott MRN: [id number] DOB:07/02/2000, 22 y.o., female Today's Date: 11/30/2022    END OF SESSION:  PT End of Session - 11/30/22 1621     Visit Number 4    Number of Visits 11   recert   Date for PT Re-Evaluation 01/20/23   recert   Authorization Type [place] MEDICAID [employer]    PT Start Time 1620   pt arrived late   PT Stop Time 1655    PT Time Calculation (min) 35 min    Activity Tolerance Patient limited by pain    Behavior During Therapy [facility] for tasks assessed/performed               Past Medical History:  Diagnosis Date   Anemia    Depression 2018   Seizures (HCC) Dec 31-2022   Preeclampsia   Past Surgical History:  Procedure Laterality Date   CESAREAN SECTION  01/01/2021   Procedure: CESAREAN SECTION;  Surgeon: Federico Flake, MD;  Location: [facility] LD ORS;  Service: Obstetrics;;   WISDOM TOOTH EXTRACTION  2020   Patient Active Problem List   Diagnosis Date Noted   Chest pain 08/11/2022   Screening for STDs (sexually transmitted diseases) 08/11/2022   Menstrual period late 08/11/2022   Seasonal allergies 08/11/2022   Wheezing on auscultation 08/11/2022   Preeclampsia in postpartum period 01/10/2021   Postpartum care following cesarean delivery 01/06/2021   Gestational hypertension 12/30/2020   Atypical squamous cell changes of undetermined significance (ASCUS) on vaginal cytology 06/23/2020   Moderate episode of recurrent major depressive disorder (HCC) 04/07/2017   Generalized anxiety disorder 04/07/2017   Sleep difficulties 04/07/2017   Class 3 severe obesity due to excess calories with body mass index (BMI) of 40.0 to 44.9 in adult (HCC) 08/09/2016    PCP: None at this time  REFERRING PROVIDER: Billey Co, MD  REFERRING DIAG: Lumbar pain (M54.5) and thoracic spine pain (M54.6)  Rationale for Evaluation and Treatment:  Rehabilitation  THERAPY DIAG:  Pain in thoracic spine  Muscle weakness (generalized)  Chronic left shoulder pain  ONSET DATE: 07/14/2022 (referral date)  SUBJECTIVE:                                                                                                                                                                                           SUBJECTIVE STATEMENT:   "[name]" "[name]"  Pt reports not having any back pain today. Pt reports her L shoulder is "iffy", 6/10 pain today that comes and goes. Pt reports she was pretty sore after last session in her  L shoulder, felt tingling, took 2 hours for it to calm down, took ibuprofen but that didn't help.  Still looking for a PCP, provided information for how to find PCP online via Medicaid website.  PERTINENT HISTORY:  PMH: anxiety and depression, anemia, seizures  PAIN:  Are you having pain? Yes: NPRS scale: 6/10 Pain location: left arm Pain description: tingly, pinching Aggravating factors: holding daughter or other heavy objects Relieving factors: stretching, propping shoulder on pillow  PRECAUTIONS: None  RED FLAGS: Bowel or bladder incontinence: No   WEIGHT BEARING RESTRICTIONS: No  FALLS:  Has patient fallen in last 6 months? Yes. Number of falls 1 time "that was just me being clumsy" sometime in the summer  LIVING ENVIRONMENT: Lives with: lives with their family, mom Lives in: House/apartment Stairs: No Has following equipment at home: None  OCCUPATION: not right now, was working at [employer] prior to accident  PLOF: Independent  PATIENT GOALS: "be able to manage L side more", "hold my daughter", "lifting, moving, holding"  NEXT MD VISIT: none at this time  OBJECTIVE:   DIAGNOSTIC FINDINGS:  None found in this EMR  PATIENT SURVEYS:  Modified Oswestry 8/50, 16% disability  Quick Dash 41% disability   COGNITION: Overall cognitive status: Within functional limits for tasks  assessed     SENSATION: WFL, except left arm feels "mildly painful tingling"  UPPER EXTREMITY MMT:  MMT Right eval Left eval  Shoulder flexion 5 Painful in upper trap, 4-  Shoulder extension    Shoulder abduction 5 Painful in upper trap/deltoid, 4-  Shoulder adduction    Shoulder extension    Shoulder internal rotation    Shoulder external rotation    Middle trapezius    Lower trapezius    Elbow flexion 5 5  Elbow extension 5 5  Wrist flexion    Wrist extension    Wrist ulnar deviation    Wrist radial deviation    Wrist pronation    Wrist supination    Grip strength WFL WFL   (Blank rows = not tested)   UPPER EXTREMITY ROM:  Active ROM Right eval Left eval  Shoulder flexion WFL limited  Shoulder extension    Shoulder abduction  limited  Shoulder adduction    Shoulder extension    Shoulder internal rotation    Shoulder external rotation    Elbow flexion    Elbow extension    Wrist flexion    Wrist extension    Wrist ulnar deviation    Wrist radial deviation    Wrist pronation    Wrist supination     (Blank rows = not tested)   LUMBAR ROM:   AROM eval  Flexion Painful on L  Extension Painful on L  Right lateral flexion Painful on L, full  Left lateral flexion No pain, full  Right rotation Painful, full  Left rotation No pain, full   (Blank rows = not tested)  LOWER EXTREMITY ROM:     Active  Right eval Left eval  Hip flexion [facility]  Hip extension    Hip abduction    Hip adduction    Hip internal rotation    Hip external rotation    Knee flexion    Knee extension    Ankle dorsiflexion    Ankle plantarflexion    Ankle inversion    Ankle eversion     (Blank rows = not tested)  LOWER EXTREMITY MMT:  L dorsiflexion resisted painful  MMT Right eval Left  eval  Hip flexion 5 4+  Hip extension    Hip abduction 4 4  Hip adduction    Hip internal rotation    Hip external rotation    Knee flexion 5 5  Knee extension 5 5  Ankle  dorsiflexion 5 5, "little pinch"  Ankle plantarflexion    Ankle inversion    Ankle eversion     (Blank rows = not tested)   FUNCTIONAL TESTS:  5 times sit to stand: 29.78 seconds  TODAY'S TREATMENT:     Manual Therapy Attempted to perform L shoulder glides and shoulder distraction, pain with touch and light pressure to stabilize joint so deferred this treatment this date   TherEx PROM L shoulder abduction, onset of tingling from biceps down into thumb and palm Limited to about 90 degrees PROM L shoulder flexion, very limited by muscle guarding to less than 90 degrees  Seated LUE median nerve glides x 5 reps  Seated anterior blue Swiss ball roll-outs x 10 reps to increase L shoulder flexion AAROM Seated lateral blue Swiss ball roll-outs x 10 reps to increase L shoulder abduction AAROM  Added appropriate exercises to HEP, see bolded below  PATIENT EDUCATION:  Education details: continue HEP and added to HEP, PT POC (will schedule one appt at at time) Person educated: Patient Education method: Explanation, Demonstration, Tactile cues, Verbal cues, and Handouts Education comprehension: verbalized understanding, returned demonstration, and needs further education  HOME EXERCISE PROGRAM:  Access Code: [email or web address] URL: [email or web address] Date: 11/04/2022 Prepared by: Camille Bal  Exercises - Single Arm Doorway Pec Stretch at 90 Degrees Abduction  - 1 x daily - 4 x weekly - 1 sets - 2-3 reps - 45 seconds hold - Single Arm Doorway Pec Stretch at 120 Degrees Abduction  - 1 x daily - 4 x weekly - 1 sets - 2-3 reps - 45 seconds hold - Supine Lower Trunk Rotation  - 1 x daily - 4 x weekly - 2 sets - 20 reps - Hooklying Single Knee to Chest Stretch with Towel  - 1 x daily - 4 x weekly - 1 sets - 2-3 reps - 45 seconds hold - Sit to Stand with Arms Crossed  - 1 x daily - 4 x weekly - 3 sets - 10 reps - Supine Shoulder Flexion with Dowel  - 1 x daily - 7 x weekly - 3  sets - 10 reps - Seated Shoulder External Rotation AAROM with Dowel  - 1 x daily - 7 x weekly - 3 sets - 10 reps - Supine Shoulder Abduction AAROM with Dowel  - 1 x daily - 7 x weekly - 3 sets - 10 reps - Median Nerve Flossing - Tray  - 1 x daily - 7 x weekly - 1 sets - 5 reps - Seated Shoulder Flexion Towel Slide at Table Top  - 1 x daily - 7 x weekly - 1-2 sets - 10 reps - Seated Shoulder Abduction Towel Slide at Table Top  - 1 x daily - 7 x weekly - 1-2 sets - 10 reps   ASSESSMENT:  CLINICAL IMPRESSION:  Emphasis of skilled PT session on attempting to perform L shoulder glides and distraction, unable to perform due to sensitivity of L shoulder to therapist gripping/stabilizing joint. Pt with ongoing muscle guarding limiting PROM as well. Pt with increased ROM noted with active-assist stretches with use of Swiss ball. Pt continues to benefit from skilled therapy services to work on increasing L shoulder strength  and ROM in order to improve her function. Pt would benefit from assessment by a PCP or orthopedic specialist as well as imaging of her L shoulder joint to rule out further pathology. Continue POC.    OBJECTIVE IMPAIRMENTS: decreased endurance, decreased mobility, decreased ROM, decreased strength, hypomobility, increased muscle spasms, impaired flexibility, and pain.   ACTIVITY LIMITATIONS: carrying, lifting, bending, reach over head, and caring for others  PARTICIPATION LIMITATIONS: interpersonal relationship and occupation  PERSONAL FACTORS: Time since onset of injury/illness/exacerbation and 1-2 comorbidities:    anxiety, depression, anemia, seizures, are also affecting patient's functional outcome.   REHAB POTENTIAL: Fair nearly one year since accident, pt is young and motivated  CLINICAL DECISION MAKING: Stable/uncomplicated  EVALUATION COMPLEXITY: Low   GOALS: Goals reviewed with patient? Yes   NEW SHORT TERM GOALS:   Target date: 12/23/2022  Pt will improve Quick  Dash to 40 % to demonstrate improving shoulder pain to allow increased function and patient to return to lifting, carrying objects, and holding her daughter. Baseline: (10/15) 41% disability, 52.3% disability (11/14) Goal status: NOT MET   NEW LONG TERM GOALS:  Target date: 01/20/2023  Pt will improve Quick Dash to 30 % to demonstrate improving shoulder pain to allow increased function and patient to return to lifting, carrying objects, and holding her daughter. Baseline: (10/15) 41% disability, 52.3% disability (11/14) Goal status: NOT MET  2.  Pt will be independent with final HEP for improved strength, ROM, function, and ability to complete ADLs with minimal increase in pain. Baseline: initial HEP initiated Goal status: IN PROGRESS    PLAN:  PT FREQUENCY: 1x/week  PT DURATION: 4 weeks + 8 weeks (recert)  PLANNED INTERVENTIONS: 16109- PT Re-evaluation, 97110-Therapeutic exercises, 97530- Therapeutic activity, 97112- Neuromuscular re-education, 97535- Self Care, 60454- Manual therapy, [id number]- Gait training, 97014- Electrical stimulation (unattended), [phone]- Electrical stimulation (manual), [id number]- Traction (mechanical), Patient/Family education, Balance training, Stair training, Taping, Dry Needling, Joint mobilization, Joint manipulation, Spinal manipulation, Spinal mobilization, Cryotherapy, Moist heat, Therapeutic exercises, Therapeutic activity, Neuromuscular re-education, Gait training, and Self Care.  PLAN FOR NEXT SESSION: how is L shoulder HEP? Gradual increase in L shoulder strengthening pending joint ROM, did pt find a PCP? scapular rhythm exercises-wall slides or clocks, lumbar rollouts, lifting training, sit to stands, prone?, palloff press, lifts/chops, tens/heat for pain? Pain education No blaze pods, PMH of seizures, can schedule one appointment at at time   For all possible CPT codes, reference the Planned Interventions line above.     Check all conditions that are expected  to impact treatment: {Conditions expected to impact treatment:Neurological condition and/or seizures   If treatment provided at initial evaluation, no treatment charged due to lack of authorization.         Peter Congo, PT, DPT, CSRS     11/30/2022, 4:56 PM

## 2022-12-05 IMAGING — CT CT HEAD W/O CM
4 series · 16 of 47 positions shown, 18 images · non-contrast
Comparison: None.

CLINICAL DATA: Headache, preeclampsia

EXAM:
CT HEAD WITHOUT CONTRAST
TECHNIQUE: Contiguous axial images were obtained from the base of the skull
through the vertex without intravenous contrast.

[Series 3: head bone · axial · 0.42mm/px · z∈[-89,-57]mm · 3 of 82 slices shown]
[im 9/82  bone]
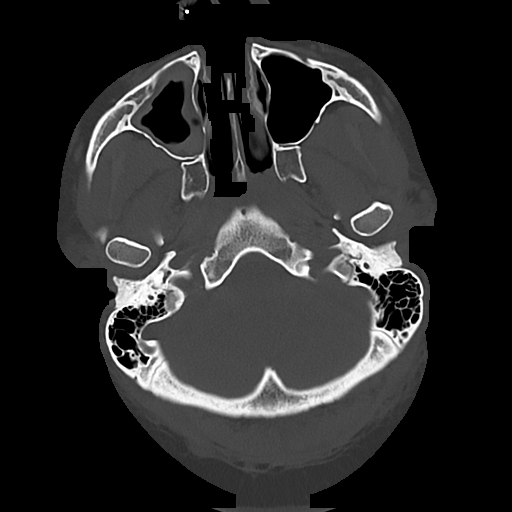
[im 17/82  bone]
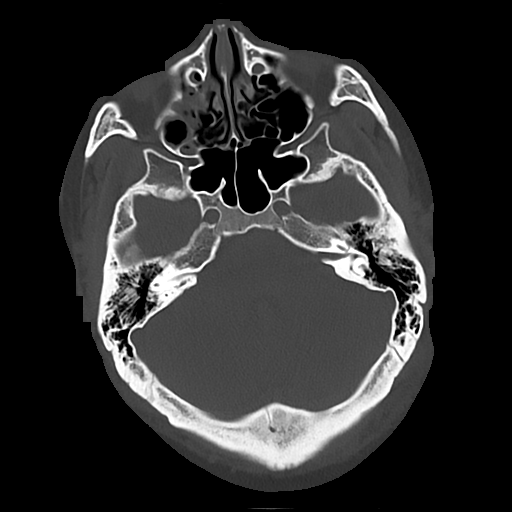
[im 25/82  bone]
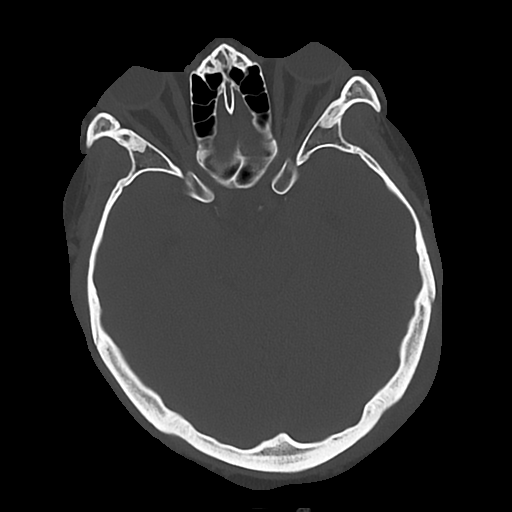

[Series 4: head wo · axial · 0.42mm/px · z∈[-85,+35]mm · 7 of 33 slices shown, 9 images]
[im 5/33  brain]
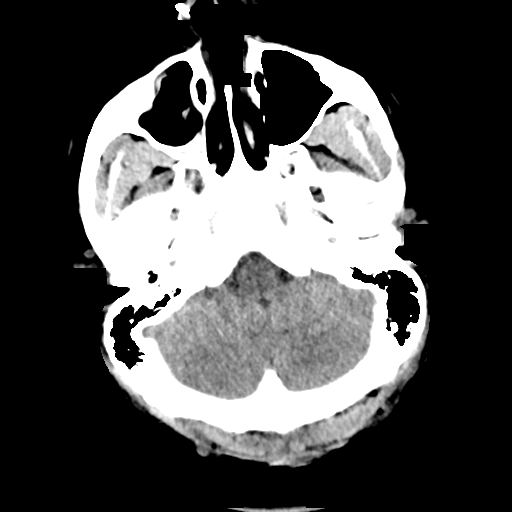
[im 5/33  bone]
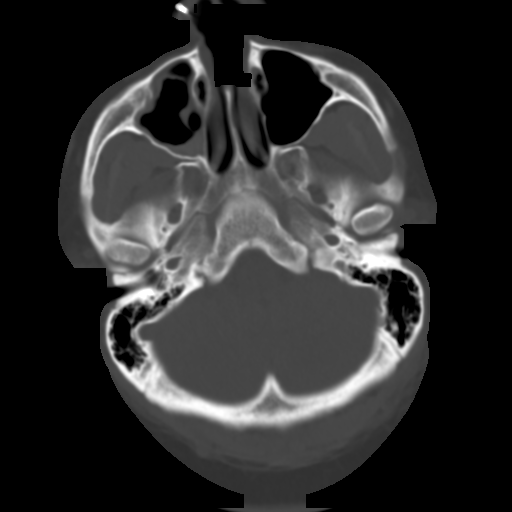
[im 9/33  brain]
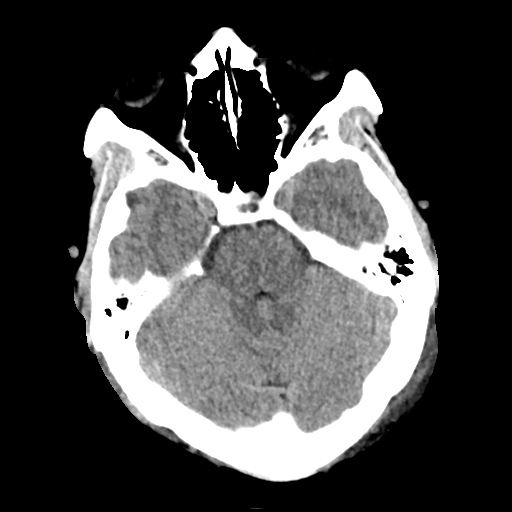
[im 13/33  brain]
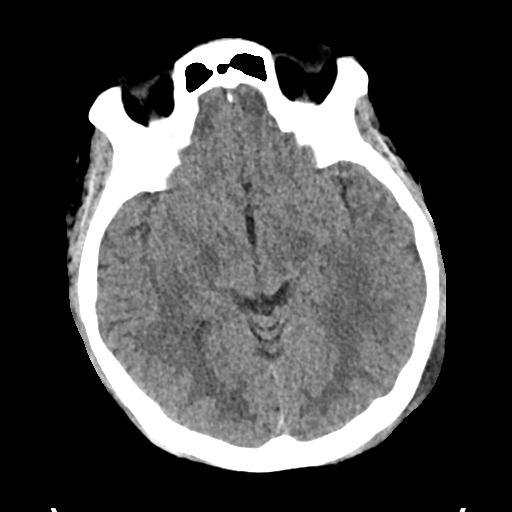
[im 17/33  brain]
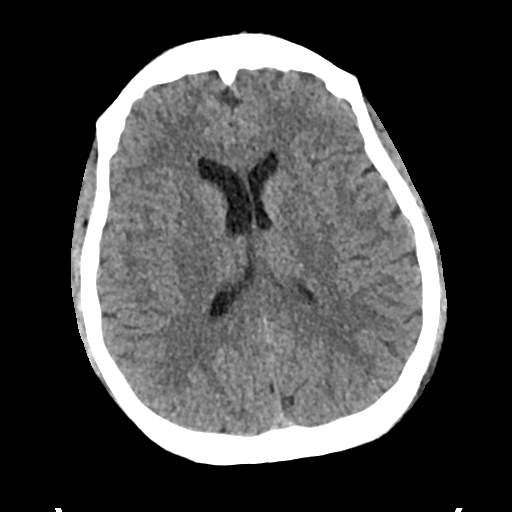
[im 21/33  brain]
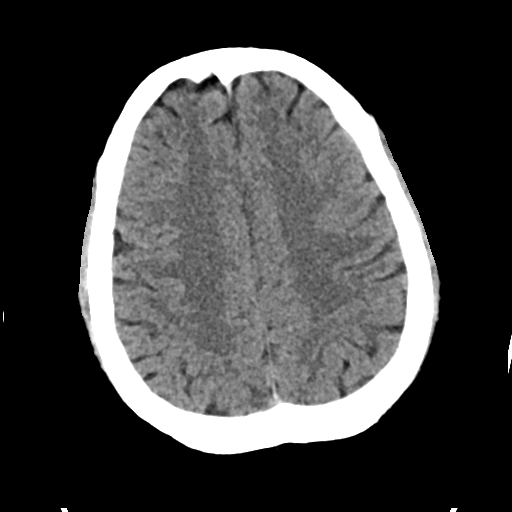
[im 21/33  bone]
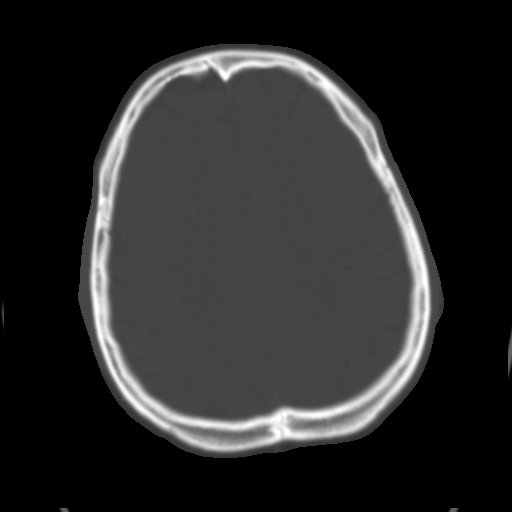
[im 25/33  brain]
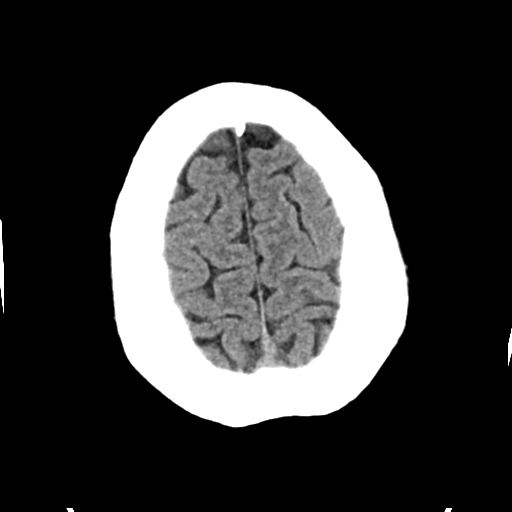
[im 29/33  brain]
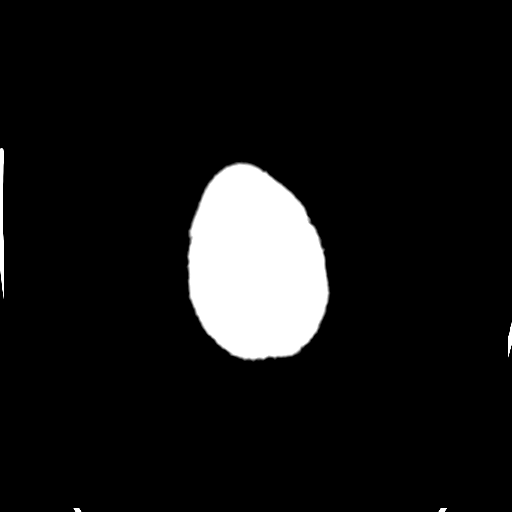

[Series 5: cor soft · coronal · 0.33mm/px · 3 of 76 slices shown]
[im 26/76  brain]
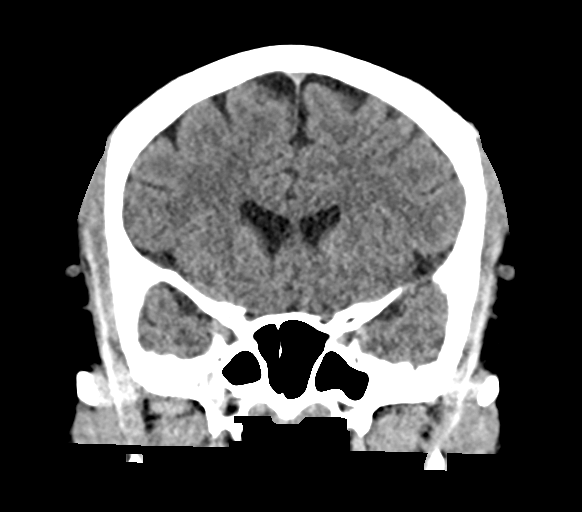
[im 34/76  brain]
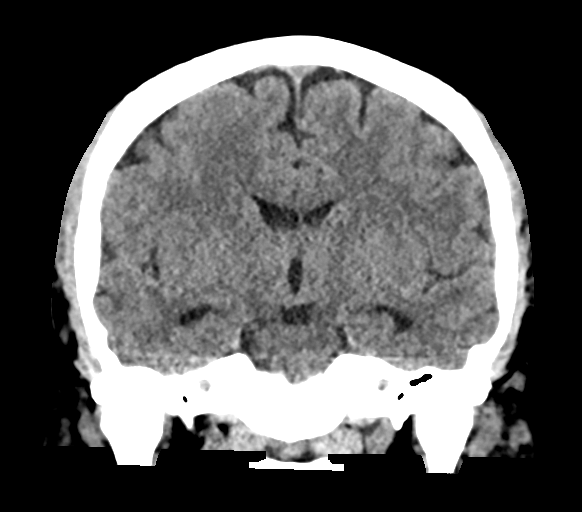
[im 42/76  brain]
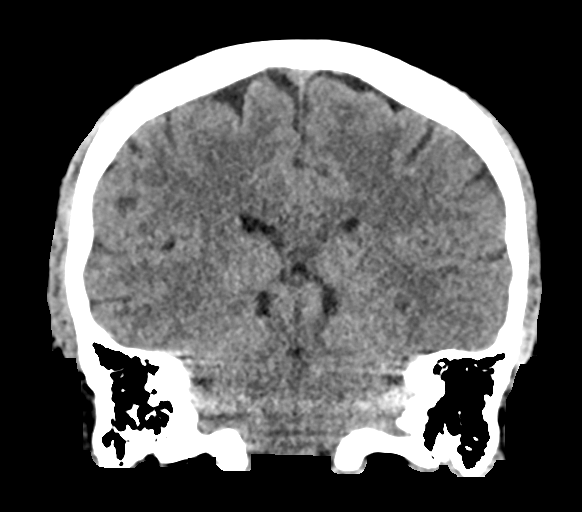

[Series 6: sag soft · sagittal · 0.33mm/px · 3 of 64 slices shown]
[im 22/64  brain]
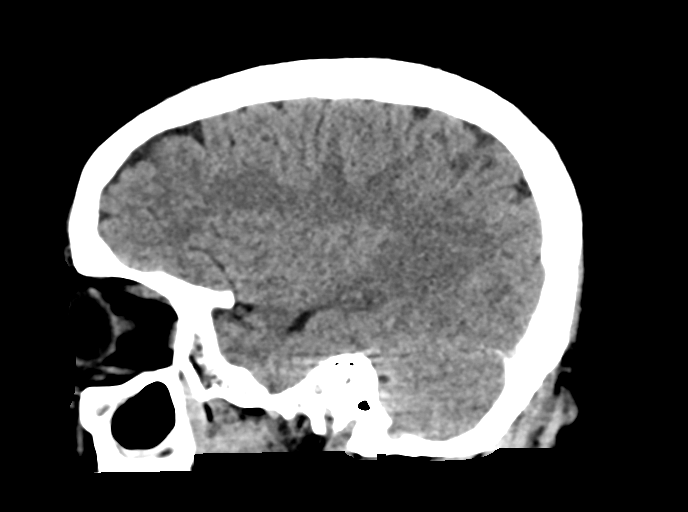
[im 32/64  brain]
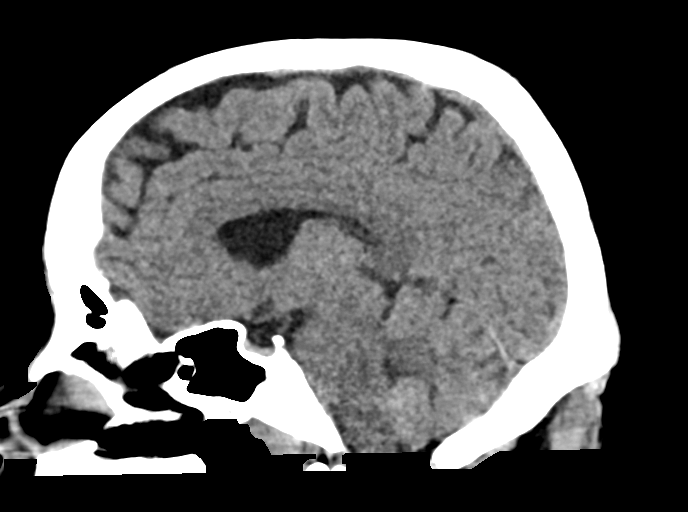
[im 43/64  brain]
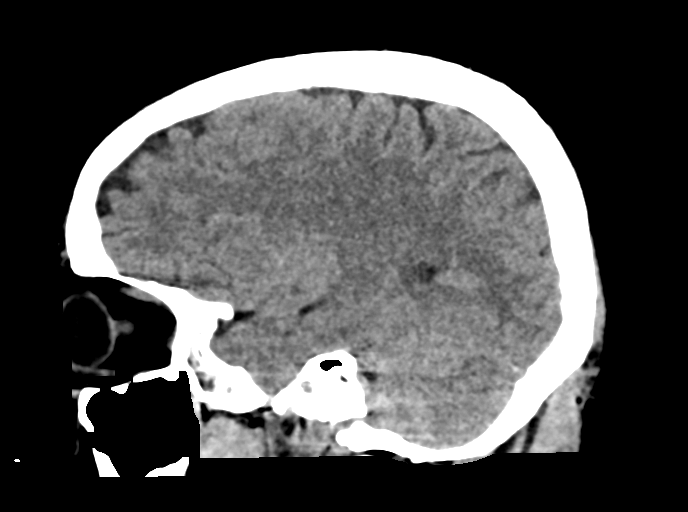

[16 of 47 positions shown; findings below may reference images not displayed]

FINDINGS: Brain: Normal anatomic configuration. No abnormal intra or
extra-axial mass lesion or fluid collection. No abnormal mass effect
or midline shift. No evidence of acute intracranial hemorrhage or
infarct. Ventricular size is normal. Cerebellum unremarkable.

Vascular: Unremarkable

Skull: Intact

Sinuses/Orbits: There is moderate mucosal thickening within the
right maxillary sinus without air-fluid level identified. Remaining
paranasal sinuses are clear. Orbits are unremarkable.

Other: Mastoid air cells and middle ear cavities are clear.
IMPRESSION: No acute intracranial abnormality.

Moderate right maxillary sinus disease.

## 2022-12-15 ENCOUNTER — Ambulatory Visit: Payer: Medicaid Other | Attending: Orthopedic Surgery | Admitting: Physical Therapy

## 2022-12-31 ENCOUNTER — Ambulatory Visit: Payer: Medicaid Other | Admitting: Family Medicine

## 2023-01-19 ENCOUNTER — Encounter: Payer: Self-pay | Admitting: Physical Therapy

## 2023-01-19 NOTE — Therapy (Signed)
 [facility] [address] [place], [place], [address] Phone: [phone]   Fax:  [phone]  Patient Details  Name: Amy Elliott MRN: [id number] Date of Birth: 11/25/2000 Referring Provider:  No ref. provider found  Encounter Date: 01/19/2023   Patient d/c from PT services after exceeding 3+ No Show appointments. Pt will need a new referral to return to PT and resume services.   Waddell Southgate, PT Waddell Southgate, PT, DPT, CSRS  01/19/2023, 9:31 AM  [facility] [address] [place], [place], [address] Phone: [phone]   Fax:  [phone]

## 2023-02-10 ENCOUNTER — Encounter: Payer: Self-pay | Admitting: Family Medicine

## 2023-02-10 ENCOUNTER — Ambulatory Visit (INDEPENDENT_AMBULATORY_CARE_PROVIDER_SITE_OTHER): Payer: Medicaid Other | Admitting: Family Medicine

## 2023-02-10 VITALS — BP 120/76 | HR 84 | Temp 98.1°F | Ht 66.0 in | Wt 271.0 lb

## 2023-02-10 DIAGNOSIS — Z6841 Body Mass Index (BMI) 40.0 and over, adult: Secondary | ICD-10-CM | POA: Diagnosis not present

## 2023-02-10 DIAGNOSIS — E66813 Obesity, class 3: Secondary | ICD-10-CM

## 2023-02-10 DIAGNOSIS — N898 Other specified noninflammatory disorders of vagina: Secondary | ICD-10-CM | POA: Diagnosis not present

## 2023-02-10 DIAGNOSIS — A749 Chlamydial infection, unspecified: Secondary | ICD-10-CM

## 2023-02-10 MED ORDER — METRONIDAZOLE 0.75 % VA GEL: 1.0000 | Freq: Every day | VAGINAL | 0 refills | Status: AC

## 2023-02-10 NOTE — Progress Notes (Signed)
I,Jameka J Llittleton, CMA,acting as a Neurosurgeon for [employer], NP.,have documented all relevant documentation on the behalf of Ellender Hose, NP,as directed by  Ellender Hose, NP while in the presence of Ellender Hose, NP.  Subjective:  Patient ID: Amy Elliott , female    DOB: Oct 27, 2000 , 23 y.o.   MRN: [id number]  Chief Complaint  Patient presents with   vaginal odor    HPI  Patient is a 23 year old female who states that she noticed that she has been having a foul, fishy smelling odor from her vagina for the past 3 days. She denies any stomach pain, vaginal pain but acknowledges some scant discharge. Patient states that she is not sexually active right now, her last sexual active she states was about 2 months ago.     Past Medical History:  Diagnosis Date   Anemia    Depression 2018   Seizures [facility]) Dec 31-2022   Preeclampsia     Family History  Problem Relation Age of Onset   Hypertension Mother    Varicose Veins Father    Diabetes Maternal Grandfather      Current Outpatient Medications:    albuterol (PROVENTIL) (2.5 MG/3ML) 0.083% nebulizer solution, Take 3 mLs (2.5 mg total) by nebulization every 4 (four) hours as needed for wheezing or shortness of breath., Disp: 75 mL, Rfl: 2   cetirizine (ZYRTEC ALLERGY) 10 MG tablet, Take 1 tablet (10 mg total) by mouth daily., Disp: 30 tablet, Rfl: 2   metroNIDAZOLE (METROGEL) 0.75 % vaginal gel, Place 1 Applicatorful vaginally at bedtime for 5 days., Disp: 50 g, Rfl: 0   Allergies  Allergen Reactions   Banana    Other Itching    Grapes with seeds     Review of Systems  Constitutional: Negative.   HENT: Negative.    Eyes: Negative.   Respiratory: Negative.    Gastrointestinal: Negative.   Genitourinary:  Positive for vaginal discharge. Negative for dyspareunia, dysuria, flank pain and genital sores.  Musculoskeletal: Negative.   Skin: Negative.   Psychiatric/Behavioral: Negative.       Today's Vitals   02/10/23 1614  BP:  120/76  Pulse: 84  Temp: 98.1 F (36.7 C)  TempSrc: Oral  Weight: 271 lb (122.9 kg)  Height: 5\' 6"  (1.676 m)  PainSc: 0-No pain   Body mass index is 43.74 kg/m.  Wt Readings from Last 3 Encounters:  02/10/23 271 lb (122.9 kg)  08/06/22 267 lb (121.1 kg)  07/20/22 276 lb 14.4 oz (125.6 kg)    The ASCVD Risk score (Arnett DK, et al., 2019) failed to calculate for the following reasons:   The 2019 ASCVD risk score is only valid for ages 82 to 59  Objective:  Physical Exam HENT:     Head: Normocephalic.     Nose: Nose normal.  Cardiovascular:     Rate and Rhythm: Normal rate and regular rhythm.  Pulmonary:     Effort: Pulmonary effort is normal.     Breath sounds: Normal breath sounds.  Abdominal:     General: Bowel sounds are normal. There is no distension.     Tenderness: There is no abdominal tenderness.  Skin:    General: Skin is warm and dry.  Neurological:     Mental Status: She is alert and oriented to person, place, and time.  Psychiatric:        Mood and Affect: Mood normal.        Behavior: Behavior normal.  Assessment And Plan:  Vaginal odor -     NuSwab Vaginitis Plus (VG+) -     metroNIDAZOLE; Place 1 Applicatorful vaginally at bedtime for 5 days.  Dispense: 50 g; Refill: 0  Class 3 severe obesity due to excess calories without serious comorbidity with body mass index (BMI) of 40.0 to 44.9 in adult [facility]) Assessment & Plan: She is encouraged to strive for BMI less than 30 to decrease cardiac risk. Advised to aim for at least 150 minutes of exercise per week.      Return in 2 months (on 04/10/2023), or if symptoms worsen or fail to improve, for physical.  Patient was given opportunity to ask questions. Patient verbalized understanding of the plan and was able to repeat key elements of the plan. All questions were answered to their satisfaction.    I, Ellender Hose, NP, have reviewed all documentation for this visit. The documentation on 02/15/2023  for the exam, diagnosis, procedures, and orders are all accurate and complete.    IF YOU HAVE BEEN REFERRED TO A SPECIALIST, IT MAY TAKE 1-2 WEEKS TO SCHEDULE/PROCESS THE REFERRAL. IF YOU HAVE NOT HEARD FROM US/SPECIALIST IN TWO WEEKS, PLEASE GIVE [place] A CALL AT [phone].

## 2023-02-14 LAB — NUSWAB VAGINITIS PLUS (VG+)
Atopobium vaginae: HIGH {score} — AB
BVAB 2: HIGH {score} — AB
Candida albicans, NAA: NEGATIVE
Candida glabrata, NAA: NEGATIVE
Chlamydia trachomatis, NAA: POSITIVE — AB
Megasphaera 1: HIGH {score} — AB
Neisseria gonorrhoeae, NAA: NEGATIVE
Trich vag by NAA: NEGATIVE

## 2023-02-15 ENCOUNTER — Encounter: Payer: Self-pay | Admitting: Family Medicine

## 2023-02-15 DIAGNOSIS — N898 Other specified noninflammatory disorders of vagina: Secondary | ICD-10-CM | POA: Insufficient documentation

## 2023-02-15 DIAGNOSIS — A749 Chlamydial infection, unspecified: Secondary | ICD-10-CM | POA: Insufficient documentation

## 2023-02-15 MED ORDER — AZITHROMYCIN 1 G PO PACK: 1.0000 g | PACK | Freq: Once | ORAL | 0 refills | Status: AC

## 2023-02-15 NOTE — Assessment & Plan Note (Signed)
 She is encouraged to strive for BMI less than 30 to decrease cardiac risk. Advised to aim for at least 150 minutes of exercise per week.

## 2023-02-15 NOTE — Assessment & Plan Note (Signed)
Safe sexual practices discussed

## 2023-02-16 ENCOUNTER — Other Ambulatory Visit: Payer: Self-pay

## 2023-02-16 MED ORDER — AZITHROMYCIN 1 G PO PACK: 1.0000 g | PACK | Freq: Once | ORAL | 0 refills | Status: AC

## 2023-02-23 ENCOUNTER — Other Ambulatory Visit: Payer: Self-pay | Admitting: Family Medicine

## 2023-04-15 ENCOUNTER — Ambulatory Visit: Admitting: Family Medicine

## 2023-04-22 ENCOUNTER — Encounter: Payer: Self-pay | Admitting: Family Medicine

## 2023-04-22 ENCOUNTER — Ambulatory Visit (INDEPENDENT_AMBULATORY_CARE_PROVIDER_SITE_OTHER): Admitting: Family Medicine

## 2023-04-22 VITALS — BP 120/80 | HR 60 | Temp 98.6°F | Ht 66.0 in | Wt 267.6 lb

## 2023-04-22 DIAGNOSIS — E66813 Obesity, class 3: Secondary | ICD-10-CM | POA: Diagnosis not present

## 2023-04-22 DIAGNOSIS — Z113 Encounter for screening for infections with a predominantly sexual mode of transmission: Secondary | ICD-10-CM

## 2023-04-22 DIAGNOSIS — M25512 Pain in left shoulder: Secondary | ICD-10-CM | POA: Diagnosis not present

## 2023-04-22 DIAGNOSIS — Z6841 Body Mass Index (BMI) 40.0 and over, adult: Secondary | ICD-10-CM

## 2023-04-22 DIAGNOSIS — Z2821 Immunization not carried out because of patient refusal: Secondary | ICD-10-CM | POA: Diagnosis not present

## 2023-04-22 MED ORDER — DICLOFENAC SODIUM 50 MG PO TBEC: 50.0000 mg | DELAYED_RELEASE_TABLET | Freq: Two times a day (BID) | ORAL | 0 refills | Status: AC

## 2023-04-22 MED ORDER — LIDOCAINE 5 % EX PTCH: 1.0000 | MEDICATED_PATCH | CUTANEOUS | 0 refills | Status: AC

## 2023-04-22 NOTE — Progress Notes (Signed)
 [place], CMA,acting as a Neurosurgeon for Amy Spry, NP.,have documented all relevant documentation on the behalf of Amy Spry, NP,as directed by  Amy Spry, NP while in the presence of Amy Spry, NP.  Subjective:  Patient ID: Amy Elliott , female    DOB: Nov 06, 2000 , 23 y.o.   MRN: [id number]  Chief Complaint  Patient presents with   Leg Pain   Arm Pain    HPI  Patient is 23 year old who presents today for left sided pain. She states the pain started after an accident in 2023 and she states the pain is mostly in her arm and leg. Patient reports she has been having physical therapy at [facility] for the past 4 weeks without much relief and so she wants further evaluation.  Patient would also like to get STD testing, she denies any symptoms.        Past Medical History:  Diagnosis Date   Anemia    Depression 2018   Seizures [facility]) Dec 31-2022   Preeclampsia     Family History  Problem Relation Age of Onset   Hypertension Mother    Varicose Veins Father    Diabetes Maternal Grandfather      Current Outpatient Medications:    albuterol  (PROVENTIL ) (2.5 MG/3ML) 0.083% nebulizer solution, Take 3 mLs (2.5 mg total) by nebulization every 4 (four) hours as needed for wheezing or shortness of breath., Disp: 75 mL, Rfl: 2   cetirizine  (ZYRTEC  ALLERGY) 10 MG tablet, Take 1 tablet (10 mg total) by mouth daily., Disp: 30 tablet, Rfl: 2   diclofenac  (VOLTAREN ) 50 MG EC tablet, Take 1 tablet (50 mg total) by mouth 2 (two) times daily for 20 days., Disp: 40 tablet, Rfl: 0   Allergies  Allergen Reactions   Banana    Other Itching    Grapes with seeds     Review of Systems  Constitutional: Negative.   HENT: Negative.    Respiratory: Negative.    Cardiovascular: Negative.   Musculoskeletal:  Positive for arthralgias.  Psychiatric/Behavioral: Negative.       Today's Vitals   04/22/23 1030  BP: 120/80  Pulse: 60  Temp: 98.6 F (37 C)  TempSrc: Oral  Weight: 267  lb 9.6 oz (121.4 kg)  Height: 5\' 6"  (1.676 m)  PainSc: 0-No pain   Body mass index is 43.19 kg/m.  Wt Readings from Last 3 Encounters:  04/22/23 267 lb 9.6 oz (121.4 kg)  02/10/23 271 lb (122.9 kg)  08/06/22 267 lb (121.1 kg)    The ASCVD Risk score (Arnett DK, et al., 2019) failed to calculate for the following reasons:   The 2019 ASCVD risk score is only valid for ages 59 to 14  Objective:  Physical Exam HENT:     Head: Normocephalic.  Cardiovascular:     Rate and Rhythm: Normal rate and regular rhythm.  Pulmonary:     Effort: Pulmonary effort is normal.     Breath sounds: Normal breath sounds.  Musculoskeletal:        General: Tenderness present.  Skin:    General: Skin is warm and dry.  Neurological:     Mental Status: She is alert and oriented to person, place, and time.         Assessment And Plan:  Pain in joint of left shoulder -     Diclofenac  Sodium; Take 1 tablet (50 mg total) by mouth 2 (two) times daily for 20 days.  Dispense: 40 tablet;  Refill: 0 -     Ambulatory referral to Orthopedic Surgery -     BMP8+eGFR -     CBC with Differential/Platelet -     Lidocaine ; Place 1 patch onto the skin daily for 10 days. Remove & Discard patch within 12 hours or as directed by MD  Dispense: 10 patch; Refill: 0  Screening for STDs (sexually transmitted diseases) -     RPR -     HSV 1 and 2 Ab, IgG -     HIV Antibody (routine testing w rflx) -     NuSwab Vaginitis Plus (VG+)  COVID-19 vaccination declined  Tetanus, diphtheria, and acellular pertussis (Tdap) vaccination declined  Class 3 severe obesity due to excess calories with serious comorbidity and body mass index (BMI) of 40.0 to 44.9 in adult [facility]) Assessment & Plan: She is encouraged to strive for BMI less than 30 to decrease cardiac risk. Advised to aim for at least 150 minutes of exercise per week.      Return in about 3 months (around 07/22/2023) for phy when able. .  Patient was given opportunity to  ask questions. Patient verbalized understanding of the plan and was able to repeat key elements of the plan. All questions were answered to their satisfaction.    I, Amy Spry, NP, have reviewed all documentation for this visit. The documentation on 05/03/2023 for the exam, diagnosis, procedures, and orders are all accurate and complete.    IF YOU HAVE BEEN REFERRED TO A SPECIALIST, IT MAY TAKE 1-2 WEEKS TO SCHEDULE/PROCESS THE REFERRAL. IF YOU HAVE NOT HEARD FROM US /SPECIALIST IN TWO WEEKS, PLEASE GIVE US  A CALL AT [phone].

## 2023-04-23 LAB — CBC WITH DIFFERENTIAL/PLATELET
Basophils Absolute: 0 10*3/uL (ref 0.0–0.2)
Basos: 0 %
EOS (ABSOLUTE): 0.2 10*3/uL (ref 0.0–0.4)
Eos: 5 %
Hematocrit: 34.3 % (ref 34.0–46.6)
Hemoglobin: 11.3 g/dL (ref 11.1–15.9)
Immature Grans (Abs): 0 10*3/uL (ref 0.0–0.1)
Immature Granulocytes: 0 %
Lymphocytes Absolute: 1.9 10*3/uL (ref 0.7–3.1)
Lymphs: 41 %
MCH: 27.8 pg (ref 26.6–33.0)
MCHC: 32.9 g/dL (ref 31.5–35.7)
MCV: 84 fL (ref 79–97)
Monocytes Absolute: 0.4 10*3/uL (ref 0.1–0.9)
Monocytes: 8 %
Neutrophils Absolute: 2.1 10*3/uL (ref 1.4–7.0)
Neutrophils: 46 %
Platelets: 296 10*3/uL (ref 150–450)
RBC: 4.07 x10E6/uL (ref 3.77–5.28)
RDW: 12.7 % (ref 11.7–15.4)
WBC: 4.6 10*3/uL (ref 3.4–10.8)

## 2023-04-23 LAB — BMP8+EGFR
BUN/Creatinine Ratio: 11 (ref 9–23)
BUN: 11 mg/dL (ref 6–20)
CO2: 22 mmol/L (ref 20–29)
Calcium: 9.4 mg/dL (ref 8.7–10.2)
Chloride: 103 mmol/L (ref 96–106)
Creatinine, Ser: 1.01 mg/dL — ABNORMAL HIGH (ref 0.57–1.00)
Glucose: 94 mg/dL (ref 70–99)
Potassium: 4.5 mmol/L (ref 3.5–5.2)
Sodium: 140 mmol/L (ref 134–144)
eGFR: 80 mL/min/{1.73_m2} (ref >59)

## 2023-04-23 LAB — RPR: RPR Ser Ql: NONREACTIVE

## 2023-04-23 LAB — HSV 1 AND 2 AB, IGG
HSV 1 Glycoprotein G Ab, IgG: NONREACTIVE
HSV 2 IgG, Type Spec: REACTIVE — AB

## 2023-04-23 LAB — HIV ANTIBODY (ROUTINE TESTING W REFLEX): HIV Screen 4th Generation wRfx: NONREACTIVE

## 2023-04-25 LAB — NUSWAB VAGINITIS PLUS (VG+)
Atopobium vaginae: HIGH {score} — AB
BVAB 2: HIGH {score} — AB
Candida albicans, NAA: NEGATIVE
Candida glabrata, NAA: NEGATIVE
Chlamydia trachomatis, NAA: NEGATIVE
Megasphaera 1: HIGH {score} — AB
Neisseria gonorrhoeae, NAA: NEGATIVE
Trich vag by NAA: NEGATIVE

## 2023-05-03 ENCOUNTER — Encounter: Payer: Self-pay | Admitting: Family Medicine

## 2023-05-03 NOTE — Assessment & Plan Note (Signed)
 She is encouraged to strive for BMI less than 30 to decrease cardiac risk. Advised to aim for at least 150 minutes of exercise per week.

## 2023-05-10 ENCOUNTER — Other Ambulatory Visit (INDEPENDENT_AMBULATORY_CARE_PROVIDER_SITE_OTHER): Payer: Self-pay

## 2023-05-10 ENCOUNTER — Ambulatory Visit (INDEPENDENT_AMBULATORY_CARE_PROVIDER_SITE_OTHER): Admitting: Physician Assistant

## 2023-05-10 DIAGNOSIS — G8929 Other chronic pain: Secondary | ICD-10-CM | POA: Diagnosis not present

## 2023-05-10 DIAGNOSIS — M25512 Pain in left shoulder: Secondary | ICD-10-CM

## 2023-05-10 MED ORDER — LIDOCAINE HCL 1 % IJ SOLN
4.0000 mL | INTRAMUSCULAR | Status: AC | PRN
  Administered 2023-05-10: 4 mL

## 2023-05-10 MED ORDER — METHYLPREDNISOLONE ACETATE 40 MG/ML IJ SUSP
80.0000 mg | INTRAMUSCULAR | Status: AC | PRN
  Administered 2023-05-10: 80 mg via INTRA_ARTICULAR

## 2023-05-10 MED ORDER — BUPIVACAINE HCL 0.25 % IJ SOLN
4.0000 mL | INTRAMUSCULAR | Status: AC | PRN
  Administered 2023-05-10: 4 mL via INTRA_ARTICULAR
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
## 2023-05-26 ENCOUNTER — Ambulatory Visit

## 2023-05-26 VITALS — BP 124/81 | HR 79

## 2023-05-26 DIAGNOSIS — Z3201 Encounter for pregnancy test, result positive: Secondary | ICD-10-CM | POA: Diagnosis not present

## 2023-05-26 LAB — POCT URINE PREGNANCY: Preg Test, Ur: POSITIVE — AB

## 2023-05-26 MED ORDER — PROMETHAZINE HCL 25 MG PO TABS: 25.0000 mg | ORAL_TABLET | Freq: Four times a day (QID) | ORAL | 1 refills | Status: AC | PRN

## 2023-05-26 MED ORDER — PRENATAL 28-0.8 MG PO TABS: 1.0000 | ORAL_TABLET | Freq: Every day | ORAL | 12 refills | Status: DC

## 2023-05-26 NOTE — Progress Notes (Signed)
 Amy Elliott here for a UPT. Pt had a positive upt at home. LMP is 04/20/23.     UPT in office Positive.    Reviewed medications and informed to start a PNV, if not already. Pt to follow up in 3 weeks for New OB Intake visit.

## 2023-05-27 ENCOUNTER — Encounter: Payer: Self-pay | Admitting: Obstetrics and Gynecology

## 2023-06-15 ENCOUNTER — Ambulatory Visit: Admitting: *Deleted

## 2023-06-15 ENCOUNTER — Other Ambulatory Visit (INDEPENDENT_AMBULATORY_CARE_PROVIDER_SITE_OTHER): Payer: Self-pay

## 2023-06-15 VITALS — BP 129/80 | HR 55 | Wt 259.8 lb

## 2023-06-15 DIAGNOSIS — O0990 Supervision of high risk pregnancy, unspecified, unspecified trimester: Secondary | ICD-10-CM | POA: Insufficient documentation

## 2023-06-15 DIAGNOSIS — Z3A01 Less than 8 weeks gestation of pregnancy: Secondary | ICD-10-CM | POA: Diagnosis not present

## 2023-06-15 DIAGNOSIS — O3680X0 Pregnancy with inconclusive fetal viability, not applicable or unspecified: Secondary | ICD-10-CM

## 2023-06-15 DIAGNOSIS — O219 Vomiting of pregnancy, unspecified: Secondary | ICD-10-CM

## 2023-06-15 MED ORDER — DOXYLAMINE-PYRIDOXINE 10-10 MG PO TBEC: 2.0000 | DELAYED_RELEASE_TABLET | Freq: Every day | ORAL | 5 refills | Status: AC

## 2023-06-15 MED ORDER — BLOOD PRESSURE KIT DEVI: 1.0000 | 0 refills | Status: AC

## 2023-06-15 NOTE — Progress Notes (Signed)
 New OB Intake  (Early IUP vs missed AB on 06/15/23, repeat US  sched. 06/23/23)   I connected with Milanya Trent  on 06/15/23 at 10:15 AM EDT by IN Person Visit and verified that I am speaking with the correct person using two identifiers. Nurse is located at [facility] and pt is located at [place].  I discussed the limitations, risks, security and privacy concerns of performing an evaluation and management service by telephone and the availability of in person appointments. I also discussed with the patient that there may be a patient responsible charge related to this service. The patient expressed understanding and agreed to proceed.  I explained I am completing New OB Intake today. We discussed EDD of 01/25/2024, Date entered prior to episode creation. Pt is G3P1011. I reviewed her allergies, medications and Medical/Surgical/OB history.    Patient Active Problem List   Diagnosis Date Noted   Supervision of high risk pregnancy, antepartum 06/15/2023   Pain in joint of left shoulder 04/22/2023   COVID-19 vaccination declined 04/22/2023   Tetanus, diphtheria, and acellular pertussis (Tdap) vaccination declined 04/22/2023   Vaginal odor 02/15/2023   Chlamydia infection 02/15/2023   Chest pain 08/11/2022   Screening for STDs (sexually transmitted diseases) 08/11/2022   Menstrual period late 08/11/2022   Seasonal allergies 08/11/2022   Wheezing on auscultation 08/11/2022   Preeclampsia in postpartum period 01/10/2021   Postpartum care following cesarean delivery 01/06/2021   Gestational hypertension 12/30/2020   Atypical squamous cell changes of undetermined significance (ASCUS) on vaginal cytology 06/23/2020   Moderate episode of recurrent major depressive disorder (HCC) 04/07/2017   Generalized anxiety disorder 04/07/2017   Sleep difficulties 04/07/2017   Class 3 severe obesity due to excess calories with body mass index (BMI) of 40.0 to 44.9 in adult 08/09/2016     Concerns addressed  today  Delivery Plans Plans to deliver at [facility]. Discussed the nature of our practice with multiple providers including residents and students. Due to the size of the practice, the delivering provider may not be the same as those providing prenatal care.   Patient is not a candidate for water  birth.  MyChart/Babyscripts MyChart access verified. I explained pt will have some visits in office and some virtually. Babyscripts instructions given and order placed. Patient verifies receipt of registration text/e-mail. Account successfully created and app downloaded. If patient is a candidate for Optimized scheduling, add to sticky note.   Blood Pressure Cuff/Weight Scale Blood pressure cuff ordered for patient to pick-up from [employer]. Explained after first prenatal appt pt will check weekly and document in Babyscripts. Patient does not have weight scale; patient may purchase if they desire to track weight weekly in Babyscripts.  Anatomy US  Explained first scheduled US  will be around 19 weeks. Anatomy US  scheduled for NA at NA/ non viable at this time.  Is patient a candidate for Babyscripts Optimization? No, due to Risk Factors   First visit review I reviewed new OB appt with patient. Explained pt will be seen by Dr. April Knack at first visit. Discussed [facility] genetic screening with patient. Requests Panorama. Routine prenatal labs OB Urine only collected at today's visit.   Last Pap Diagnosis  Date Value Ref Range Status  06/19/2020 (A)     - Atypical squamous cells of undetermined significance (ASC-US )    Donette Furlong, RN 06/15/2023  10:58 AM

## 2023-06-23 ENCOUNTER — Other Ambulatory Visit (INDEPENDENT_AMBULATORY_CARE_PROVIDER_SITE_OTHER): Payer: Self-pay

## 2023-06-23 ENCOUNTER — Ambulatory Visit: Admitting: *Deleted

## 2023-06-23 VITALS — BP 110/69 | HR 55 | Wt 257.0 lb

## 2023-06-23 DIAGNOSIS — O0991 Supervision of high risk pregnancy, unspecified, first trimester: Secondary | ICD-10-CM

## 2023-06-23 DIAGNOSIS — Z3A01 Less than 8 weeks gestation of pregnancy: Secondary | ICD-10-CM

## 2023-06-23 DIAGNOSIS — O0990 Supervision of high risk pregnancy, unspecified, unspecified trimester: Secondary | ICD-10-CM

## 2023-06-23 DIAGNOSIS — O3680X0 Pregnancy with inconclusive fetal viability, not applicable or unspecified: Secondary | ICD-10-CM

## 2023-06-23 NOTE — Progress Notes (Signed)
 Amy Elliott presents for repeat US  for viability and dating. Possible early IUP noted on US  06/15/23. GS measuring 7wks on that scan. Possible FP measuring 6wks on that scan. On today's scan, GS again measuring 7 wks. YS more prominent. No FP visualized. Consulted with Dr. Racheal Buddle. Advised repeat US  in 2 weeks. Patient advised of all results and all questions were answered. Heavy VB precautions reviewed. Pt scheduled for f/u US  at checkout.

## 2023-06-27 ENCOUNTER — Inpatient Hospital Stay (HOSPITAL_COMMUNITY)

## 2023-06-27 ENCOUNTER — Other Ambulatory Visit: Payer: Self-pay

## 2023-06-27 ENCOUNTER — Inpatient Hospital Stay (HOSPITAL_COMMUNITY)
Admission: AD | Admit: 2023-06-27 | Discharge: 2023-06-28 | Disposition: A | Discharge status: Home / Self Care | Attending: Obstetrics and Gynecology | Admitting: Obstetrics and Gynecology

## 2023-06-27 DIAGNOSIS — O2311 Infections of bladder in pregnancy, first trimester: Secondary | ICD-10-CM | POA: Insufficient documentation

## 2023-06-27 DIAGNOSIS — O09291 Supervision of pregnancy with other poor reproductive or obstetric history, first trimester: Secondary | ICD-10-CM | POA: Insufficient documentation

## 2023-06-27 DIAGNOSIS — Z3A09 9 weeks gestation of pregnancy: Secondary | ICD-10-CM | POA: Diagnosis not present

## 2023-06-27 DIAGNOSIS — O209 Hemorrhage in early pregnancy, unspecified: Secondary | ICD-10-CM | POA: Diagnosis not present

## 2023-06-27 DIAGNOSIS — R109 Unspecified abdominal pain: Secondary | ICD-10-CM | POA: Diagnosis not present

## 2023-06-27 DIAGNOSIS — B9689 Other specified bacterial agents as the cause of diseases classified elsewhere: Secondary | ICD-10-CM | POA: Insufficient documentation

## 2023-06-27 DIAGNOSIS — O23591 Infection of other part of genital tract in pregnancy, first trimester: Secondary | ICD-10-CM | POA: Diagnosis not present

## 2023-06-27 DIAGNOSIS — Z3A01 Less than 8 weeks gestation of pregnancy: Secondary | ICD-10-CM | POA: Diagnosis not present

## 2023-06-27 DIAGNOSIS — N3000 Acute cystitis without hematuria: Secondary | ICD-10-CM | POA: Diagnosis not present

## 2023-06-27 LAB — URINALYSIS, ROUTINE W REFLEX MICROSCOPIC
Bilirubin Urine: NEGATIVE
Glucose, UA: NEGATIVE mg/dL
Ketones, ur: NEGATIVE mg/dL
Nitrite: POSITIVE — AB
Protein, ur: NEGATIVE mg/dL
Specific Gravity, Urine: 1.019 (ref 1.005–1.030)
pH: 7 (ref 5.0–8.0)

## 2023-06-27 LAB — WET PREP, GENITAL
Sperm: NONE SEEN
Trich, Wet Prep: NONE SEEN
WBC, Wet Prep HPF POC: <10 (ref <10)
Yeast Wet Prep HPF POC: NONE SEEN

## 2023-06-27 LAB — CBC
HCT: 32 % — ABNORMAL LOW (ref 36.0–46.0)
Hemoglobin: 10.6 g/dL — ABNORMAL LOW (ref 12.0–15.0)
MCH: 28.1 pg (ref 26.0–34.0)
MCHC: 33.1 g/dL (ref 30.0–36.0)
MCV: 84.9 fL (ref 80.0–100.0)
Platelets: 296 10*3/uL (ref 150–400)
RBC: 3.77 MIL/uL — ABNORMAL LOW (ref 3.87–5.11)
RDW: 13 % (ref 11.5–15.5)
WBC: 5.2 10*3/uL (ref 4.0–10.5)
nRBC: 0 % (ref 0.0–0.2)

## 2023-06-27 LAB — HCG, QUANTITATIVE, PREGNANCY: hCG, Beta Chain, Quant, S: 20365 m[IU]/mL — ABNORMAL HIGH (ref <5)

## 2023-06-27 NOTE — MAU Note (Signed)
 Amy Elliott is a 23 y.o. at [redacted]w[redacted]d here in MAU reporting: spotting yesterday, around 1730 today VB got heavier and started having severe abdominal pain pulling and stabbing.   LMP: NA Onset of complaint: 1730 Pain score: 9 Vitals:   06/27/23 1955  BP: 126/73  Pulse: 60  Resp: 17  Temp: 98.6 F (37 C)  SpO2: 100%     FHT: NA   Lab orders placed from triage: UA

## 2023-06-27 NOTE — MAU Provider Note (Incomplete)
 Chief Complaint:  Abdominal Pain and Vaginal Bleeding   HPI   None     Amy Elliott is a 23 y.o. G3P1011 at [redacted]w[redacted]d presenting to maternity admissions reporting vaginal bleeding. She endorses spotting that started yesterday.  Around 1730 today vaginal bleeding became heavier and she started experiencing severe abdominal pain 9/10 that she describes as a pulling and stabbing pain. Positive history for history of prior miscarriage, negative for tubal ligation and IUD in situ. They have had IUP confirmed with ultrasound.  On most recent ultrasound, growth sac measuring 7 weeks, more prominent yolk sac, no fetal pole.   Pregnancy Course: Receives care at [facility]. Prenatal records reviewed. Possible early IUP on US  06/15/2023 with recommendation for repeat US  in 2 weeks.  Past Medical History:  Diagnosis Date  . Anemia   . Depression 2018  . Seizures [facility]) Dec 31-2022   Eclampsia   OB History  Gravida Para Term Preterm AB Living  3 1 1  0 1 1  SAB IAB Ectopic Multiple Live Births  1 0 0 0 1    # Outcome Date GA Lbr Len/2nd Weight Sex Type Anes PTL Lv  3 Current           2 Term 01/01/21 [redacted]w[redacted]d  3770 g F CS-LTranv Spinal  LIV  1 SAB 02/23/20     SAB      Past Surgical History:  Procedure Laterality Date  . CESAREAN SECTION  01/01/2021   Procedure: CESAREAN SECTION;  Surgeon: Abner Ables, MD;  Location: [facility] LD [facility]S;  Service: Obstetrics;;  . WISDOM TOOTH EXTRACTION  2020   Family History  Problem Relation Age of Onset  . Hypertension Mother   . Heart Problems Mother        Pacemaker w/ defib  . Varicose Veins Father   . Diabetes Maternal Grandfather    Social History   Tobacco Use  . Smoking status: Some Days    Current packs/day: 0.50    Average packs/day: 0.5 packs/day for 0.5 years (0.3 ttl pk-yrs)    Types: Cigars, Cigarettes  . Smokeless tobacco: Never  Vaping Use  . Vaping status: Some Days  Substance Use Topics  . Alcohol use: Not Currently    Comment: not  since confirmed pregnancy. Last drink in January  . Drug use: Yes    Types: Marijuana    Comment: April 2022   Allergies  Allergen Reactions  . Banana   . Other Itching    Grapes with seeds   Medications Prior to Admission  Medication Sig Dispense Refill Last Dose/Taking  . albuterol  (PROVENTIL ) (2.5 MG/3ML) 0.083% nebulizer solution Take 3 mLs (2.5 mg total) by nebulization every 4 (four) hours as needed for wheezing or shortness of breath. (Patient not taking: Reported on 06/15/2023) 75 mL 2   . Blood Pressure Monitoring (BLOOD PRESSURE KIT) DEVI 1 Device by Does not apply route once a week. 1 each 0   . cetirizine  (ZYRTEC  ALLERGY) 10 MG tablet Take 1 tablet (10 mg total) by mouth daily. 30 tablet 2   . Doxylamine -Pyridoxine  (DICLEGIS ) 10-10 MG TBEC Take 2 tablets by mouth at bedtime. If symptoms persist, add one tablet in the morning and one in the afternoon (Patient not taking: Reported on 06/15/2023) 100 tablet 5   . Prenatal 28-0.8 MG TABS Take 1 tablet by mouth daily. 30 tablet 12   . promethazine  (PHENERGAN ) 25 MG tablet Take 1 tablet (25 mg total) by mouth every 6 (six)  hours as needed for nausea or vomiting. (Patient not taking: Reported on 06/15/2023) 30 tablet 1     I have reviewed patient's Past Medical Hx, Surgical Hx, Family Hx, Social Hx, medications and allergies.   ROS  Pertinent items noted in HPI and remainder of comprehensive ROS otherwise negative.   PHYSICAL EXAM  Patient Vitals for the past 24 hrs:  BP Temp Temp src Pulse Resp SpO2 Height Weight  06/27/23 1955 126/73 98.6 F (37 C) Oral 60 17 100 % 5' 6 (1.676 m) 119.1 kg    Constitutional: Well-developed, well-nourished female in no acute distress.  HEENT: atraumatic, normocephalic. Neck has normal ROM. EOM intact. Cardiovascular: normal rate & rhythm, warm and well-perfused Respiratory: normal effort, no problems with respiration noted GI: Abd soft, non-tender, non-distended MSK: Extremities nontender, no  edema, normal ROM Skin: warm and dry. Acyanotic, no jaundice or pallor. Neurologic: Alert and oriented x 4. No abnormal coordination. Psychiatric: Normal mood. Speech not slurred, not rapid/pressured. Patient is cooperative. GU: no CVA tenderness  Labs: Results for orders placed or performed during the hospital encounter of 06/27/23 (from the past 24 hours)  Urinalysis, Routine w reflex microscopic -Urine, Clean Catch     Status: Abnormal   Collection Time: 06/27/23  8:03 PM  Result Value Ref Range   Color, Urine YELLOW YELLOW   APPearance HAZY (A) CLEAR   Specific Gravity, Urine 1.019 1.005 - 1.030   pH 7.0 5.0 - 8.0   Glucose, UA NEGATIVE NEGATIVE mg/dL   Hgb urine dipstick MODERATE (A) NEGATIVE   Bilirubin Urine NEGATIVE NEGATIVE   Ketones, ur NEGATIVE NEGATIVE mg/dL   Protein, ur NEGATIVE NEGATIVE mg/dL   Nitrite POSITIVE (A) NEGATIVE   Leukocytes,Ua TRACE (A) NEGATIVE   RBC / HPF 0-5 0 - 5 RBC/hpf   WBC, UA 0-5 0 - 5 WBC/hpf   Bacteria, UA MANY (A) NONE SEEN   Squamous Epithelial / HPF 0-5 0 - 5 /HPF   Mucus PRESENT   Wet prep, genital     Status: Abnormal   Collection Time: 06/27/23 10:04 PM  Result Value Ref Range   Yeast Wet Prep HPF POC NONE SEEN NONE SEEN   Trich, Wet Prep NONE SEEN NONE SEEN   Clue Cells Wet Prep HPF POC PRESENT (A) NONE SEEN   WBC, Wet Prep HPF POC <10 <10   Sperm NONE SEEN   CBC     Status: Abnormal   Collection Time: 06/27/23 10:18 PM  Result Value Ref Range   WBC 5.2 4.0 - 10.5 K/uL   RBC 3.77 (L) 3.87 - 5.11 MIL/uL   Hemoglobin 10.6 (L) 12.0 - 15.0 g/dL   HCT 84.6 (L) 96.2 - 95.2 %   MCV 84.9 80.0 - 100.0 fL   MCH 28.1 26.0 - 34.0 pg   MCHC 33.1 30.0 - 36.0 g/dL   RDW 84.1 32.4 - 40.1 %   Platelets 296 150 - 400 K/uL   nRBC 0.0 0.0 - 0.2 %  hCG, quantitative, pregnancy     Status: Abnormal   Collection Time: 06/27/23 10:18 PM  Result Value Ref Range   hCG, Beta Chain, Quant, S 20,365 (H) <5 mIU/mL    Imaging:  US  OB LESS THAN 14  WEEKS WITH OB TRANSVAGINAL Result Date: 06/27/2023 CLINICAL DATA:  Vaginal bleeding. EXAM: OBSTETRIC <14 WK US  AND TRANSVAGINAL OB US  TECHNIQUE: Both transabdominal and transvaginal ultrasound examinations were performed for complete evaluation of the gestation as well as the maternal uterus, adnexal  regions, and pelvic cul-de-sac. Transvaginal technique was performed to assess early pregnancy. COMPARISON:  June 23, 2023 FINDINGS: Intrauterine gestational sac: Single Yolk sac:  Visualized. Embryo:  Not Visualized. Cardiac Activity: Not Visualized. Heart Rate: N/A  bpm MSD: 20.7 mm   7 w   0 d Subchorionic hemorrhage:  None visualized. Maternal uterus/adnexae: The right ovary measures 3.5 cm x 2.1 cm x 1.9 cm and is normal in appearance. The left ovary measures 2.5 cm x 2.0 cm x 1.7 cm and is normal in appearance. No pelvic free fluid is seen. IMPRESSION: Probable early intrauterine gestational sac and yolk sac, but no fetal pole or cardiac activity yet visualized. Recommend follow-up quantitative B-HCG levels and follow-up US  in 14 days to assess viability. This recommendation follows SRU consensus guidelines: Diagnostic Criteria for Nonviable Pregnancy Early in the First Trimester. Mel Spine Med 2013; 409:8119-14. Electronically Signed   By: Virgle Grime M.D.   On: 06/27/2023 22:50    MDM & MAU COURSE  MDM: {[id number]}  MAU Course: -Vital signs within normal limits. - UA positive for UTI with positive nitrite and leukocytes, treat with antibiotics. Wet prep positive for BV. -CBC, ultrasound, beta given new onset heavy vaginal bleeding and abdominal pain.  Blood type on file, A positive. -CBC shows anemia with Hgb 10.6, recommend IV iron . -hCG 20,365. US  shows IUGS and YS, no fetal pole. On US  06/15/2023, IUGS and YS with possible fetal pole noted, no definitive change on this US .  Differential diagnosis considered for 1st trimester vaginal bleeding includes but is not limited to: ectopic  pregnancy, complete spontaneous abortion, incomplete abortion, missed abortion, threatened abortion, embryonic/fetal demise, cervical insufficiency, cervical or vaginal disorder   Orders Placed This Encounter  Procedures  . Wet prep, genital  . US  OB LESS THAN 14 WEEKS WITH OB TRANSVAGINAL  . Urinalysis, Routine w reflex microscopic -Urine, Clean Catch  . CBC  . hCG, quantitative, pregnancy  . Diet NPO time specified   No orders of the defined types were placed in this encounter.   ASSESSMENT   1. Vaginal bleeding in pregnancy, first trimester   2. [redacted] weeks gestation of pregnancy     PLAN  Discharge home in stable condition with return precautions.  ***     Allergies as of 06/28/2023       Reactions   Banana    Other Itching   Grapes with seeds        Medication List     TAKE these medications    albuterol  (2.5 MG/3ML) 0.083% nebulizer solution Commonly known as: PROVENTIL  Take 3 mLs (2.5 mg total) by nebulization every 4 (four) hours as needed for wheezing or shortness of breath.   Blood Pressure Kit Devi 1 Device by Does not apply route once a week.   cetirizine  10 MG tablet Commonly known as: ZyrTEC  Allergy Take 1 tablet (10 mg total) by mouth daily.   Doxylamine -Pyridoxine  10-10 MG Tbec Commonly known as: Diclegis  Take 2 tablets by mouth at bedtime. If symptoms persist, add one tablet in the morning and one in the afternoon   Prenatal 28-0.8 MG Tabs Take 1 tablet by mouth daily.   promethazine  25 MG tablet Commonly known as: PHENERGAN  Take 1 tablet (25 mg total) by mouth every 6 (six) hours as needed for nausea or vomiting.        Noreene Bearded, PA

## 2023-06-27 NOTE — MAU Provider Note (Signed)
 Chief Complaint:  Abdominal Pain and Vaginal Bleeding   HPI   None     Amy Elliott is a 23 y.o. G3P1011 at [redacted]w[redacted]d presenting to maternity admissions reporting vaginal bleeding. She endorses spotting that started yesterday.  Around 1730 today vaginal bleeding became heavier and she started experiencing severe abdominal pain 9/10 that she describes as a pulling and stabbing pain. Positive history for history of prior miscarriage, negative for tubal ligation and IUD in situ. They have had IUP confirmed with ultrasound.  On most recent ultrasound, growth sac measuring 7 weeks, more prominent yolk sac, no fetal pole.  Pregnancy Course: Receives care at [facility]. Prenatal records reviewed. Possible early IUP on US  06/15/2023 with recommendation for repeat US  in 2 weeks.  Past Medical History:  Diagnosis Date   Anemia    Depression 2018   Seizures [facility]) Dec 31-2022   Eclampsia   OB History  Gravida Para Term Preterm AB Living  3 1 1  0 1 1  SAB IAB Ectopic Multiple Live Births  1 0 0 0 1    # Outcome Date GA Lbr Len/2nd Weight Sex Type Anes PTL Lv  3 Current           2 Term 01/01/21 [redacted]w[redacted]d  3770 g F CS-LTranv Spinal  LIV  1 SAB 02/23/20     SAB      Past Surgical History:  Procedure Laterality Date   CESAREAN SECTION  01/01/2021   Procedure: CESAREAN SECTION;  Surgeon: Abner Ables, MD;  Location: [facility] LD [facility]S;  Service: Obstetrics;;   WISDOM TOOTH EXTRACTION  2020   Family History  Problem Relation Age of Onset   Hypertension Mother    Heart Problems Mother        Pacemaker w/ defib   Varicose Veins Father    Diabetes Maternal Grandfather    Social History   Tobacco Use   Smoking status: Some Days    Current packs/day: 0.50    Average packs/day: 0.5 packs/day for 0.5 years (0.3 ttl pk-yrs)    Types: Cigars, Cigarettes   Smokeless tobacco: Never  Vaping Use   Vaping status: Some Days  Substance Use Topics   Alcohol use: Not Currently    Comment: not since confirmed  pregnancy. Last drink in January   Drug use: Yes    Types: Marijuana    Comment: April 2022   Allergies  Allergen Reactions   Banana    Other Itching    Grapes with seeds   Medications Prior to Admission  Medication Sig Dispense Refill Last Dose/Taking   albuterol  (PROVENTIL ) (2.5 MG/3ML) 0.083% nebulizer solution Take 3 mLs (2.5 mg total) by nebulization every 4 (four) hours as needed for wheezing or shortness of breath. (Patient not taking: Reported on 06/15/2023) 75 mL 2    Blood Pressure Monitoring (BLOOD PRESSURE KIT) DEVI 1 Device by Does not apply route once a week. 1 each 0    cetirizine  (ZYRTEC  ALLERGY) 10 MG tablet Take 1 tablet (10 mg total) by mouth daily. 30 tablet 2    Doxylamine -Pyridoxine  (DICLEGIS ) 10-10 MG TBEC Take 2 tablets by mouth at bedtime. If symptoms persist, add one tablet in the morning and one in the afternoon (Patient not taking: Reported on 06/15/2023) 100 tablet 5    Prenatal 28-0.8 MG TABS Take 1 tablet by mouth daily. 30 tablet 12    promethazine  (PHENERGAN ) 25 MG tablet Take 1 tablet (25 mg total) by mouth every 6 (six) hours  as needed for nausea or vomiting. (Patient not taking: Reported on 06/15/2023) 30 tablet 1     I have reviewed patient's Past Medical Hx, Surgical Hx, Family Hx, Social Hx, medications and allergies.   ROS  Pertinent items noted in HPI and remainder of comprehensive ROS otherwise negative.   PHYSICAL EXAM  Patient Vitals for the past 24 hrs:  BP Temp Temp src Pulse Resp SpO2 Height Weight  06/27/23 1955 126/73 98.6 F (37 C) Oral 60 17 100 % 5' 6 (1.676 m) 119.1 kg    Constitutional: Well-developed, well-nourished female in no acute distress.  HEENT: atraumatic, normocephalic. Neck has normal ROM. EOM intact. Cardiovascular: normal rate & rhythm, warm and well-perfused Respiratory: normal effort, no problems with respiration noted GI: Abd soft, non-tender, non-distended MSK: Extremities nontender, no edema, normal ROM Skin:  warm and dry. Acyanotic, no jaundice or pallor. Neurologic: Alert and oriented x 4. No abnormal coordination. Psychiatric: Normal mood. Speech not slurred, not rapid/pressured. Patient is cooperative. GU: no CVA tenderness  Labs: Results for orders placed or performed during the hospital encounter of 06/27/23 (from the past 24 hours)  Urinalysis, Routine w reflex microscopic -Urine, Clean Catch     Status: Abnormal   Collection Time: 06/27/23  8:03 PM  Result Value Ref Range   Color, Urine YELLOW YELLOW   APPearance HAZY (A) CLEAR   Specific Gravity, Urine 1.019 1.005 - 1.030   pH 7.0 5.0 - 8.0   Glucose, UA NEGATIVE NEGATIVE mg/dL   Hgb urine dipstick MODERATE (A) NEGATIVE   Bilirubin Urine NEGATIVE NEGATIVE   Ketones, ur NEGATIVE NEGATIVE mg/dL   Protein, ur NEGATIVE NEGATIVE mg/dL   Nitrite POSITIVE (A) NEGATIVE   Leukocytes,Ua TRACE (A) NEGATIVE   RBC / HPF 0-5 0 - 5 RBC/hpf   WBC, UA 0-5 0 - 5 WBC/hpf   Bacteria, UA MANY (A) NONE SEEN   Squamous Epithelial / HPF 0-5 0 - 5 /HPF   Mucus PRESENT   Wet prep, genital     Status: Abnormal   Collection Time: 06/27/23 10:04 PM  Result Value Ref Range   Yeast Wet Prep HPF POC NONE SEEN NONE SEEN   Trich, Wet Prep NONE SEEN NONE SEEN   Clue Cells Wet Prep HPF POC PRESENT (A) NONE SEEN   WBC, Wet Prep HPF POC <10 <10   Sperm NONE SEEN   CBC     Status: Abnormal   Collection Time: 06/27/23 10:18 PM  Result Value Ref Range   WBC 5.2 4.0 - 10.5 K/uL   RBC 3.77 (L) 3.87 - 5.11 MIL/uL   Hemoglobin 10.6 (L) 12.0 - 15.0 g/dL   HCT 16.1 (L) 09.6 - 04.5 %   MCV 84.9 80.0 - 100.0 fL   MCH 28.1 26.0 - 34.0 pg   MCHC 33.1 30.0 - 36.0 g/dL   RDW 40.9 81.1 - 91.4 %   Platelets 296 150 - 400 K/uL   nRBC 0.0 0.0 - 0.2 %  hCG, quantitative, pregnancy     Status: Abnormal   Collection Time: 06/27/23 10:18 PM  Result Value Ref Range   hCG, Beta Chain, Quant, S 20,365 (H) <5 mIU/mL    Imaging:  US  OB LESS THAN 14 WEEKS WITH OB  TRANSVAGINAL Result Date: 06/27/2023 CLINICAL DATA:  Vaginal bleeding. EXAM: OBSTETRIC <14 WK US  AND TRANSVAGINAL OB US  TECHNIQUE: Both transabdominal and transvaginal ultrasound examinations were performed for complete evaluation of the gestation as well as the maternal uterus, adnexal regions,  and pelvic cul-de-sac. Transvaginal technique was performed to assess early pregnancy. COMPARISON:  June 23, 2023 FINDINGS: Intrauterine gestational sac: Single Yolk sac:  Visualized. Embryo:  Not Visualized. Cardiac Activity: Not Visualized. Heart Rate: N/A  bpm MSD: 20.7 mm   7 w   0 d Subchorionic hemorrhage:  None visualized. Maternal uterus/adnexae: The right ovary measures 3.5 cm x 2.1 cm x 1.9 cm and is normal in appearance. The left ovary measures 2.5 cm x 2.0 cm x 1.7 cm and is normal in appearance. No pelvic free fluid is seen. IMPRESSION: Probable early intrauterine gestational sac and yolk sac, but no fetal pole or cardiac activity yet visualized. Recommend follow-up quantitative B-HCG levels and follow-up US  in 14 days to assess viability. This recommendation follows SRU consensus guidelines: Diagnostic Criteria for Nonviable Pregnancy Early in the First Trimester. Mel Spine Med 2013; 161:0960-45. Electronically Signed   By: Virgle Grime M.D.   On: 06/27/2023 22:50    MDM & MAU COURSE  MDM: High  MAU Course: -Vital signs within normal limits. - UA positive for UTI with positive nitrite and leukocytes, treat with antibiotics. Wet prep positive for BV. -CBC, ultrasound, beta given new onset heavy vaginal bleeding and abdominal pain.  Blood type on file, A positive. -CBC shows anemia with Hgb 10.6, recommend IV iron . -hCG 40,981 and appropriate for gestational age. US  shows IUGS and YS, no clear fetal pole. Personally reviewed images and discussed with Dr. Nolon Baxter who also reviewed images, possible early fetal pole on images. On US  06/15/2023, IUGS and YS with possible fetal pole noted. Repeat US  in  about 10 days as scheduled.  Differential diagnosis considered for 1st trimester vaginal bleeding includes but is not limited to: ectopic pregnancy, complete spontaneous abortion, incomplete abortion, missed abortion, threatened abortion, embryonic/fetal demise, cervical insufficiency, cervical or vaginal disorder   Orders Placed This Encounter  Procedures   Wet prep, genital   US  OB LESS THAN 14 WEEKS WITH OB TRANSVAGINAL   Urinalysis, Routine w reflex microscopic -Urine, Clean Catch   CBC   hCG, quantitative, pregnancy   Diet NPO time specified   Discharge patient   Meds ordered this encounter  Medications   metroNIDAZOLE  (METROGEL ) 0.75 % vaginal gel    Sig: Place 1 Applicatorful vaginally at bedtime. Apply one applicatorful to vagina at bedtime for 5 days    Dispense:  70 g    Refill:  0   cefadroxil (DURICEF) 500 MG capsule    Sig: Take 1 capsule (500 mg total) by mouth 2 (two) times daily.    Dispense:  14 capsule    Refill:  0    ASSESSMENT   1. Vaginal bleeding in pregnancy, first trimester   2. Acute cystitis during pregnancy in first trimester   3. Bacterial vaginosis in pregnancy   4. [redacted] weeks gestation of pregnancy     PLAN  Discharge home in stable condition with heavy bleeding precautions.   Repeat US  in 10-14 days.  Follow-up Information     [facility] for [facility] Follow up.   Specialty: Obstetrics and Gynecology Why: As scheduled for ongoing prenatal care Contact information: [address] [place]  [phone]                 Allergies as of 06/28/2023       Reactions   Banana    Other Itching   Grapes with seeds  Medication List     TAKE these medications    albuterol  (2.5 MG/3ML) 0.083% nebulizer solution Commonly known as: PROVENTIL  Take 3 mLs (2.5 mg total) by nebulization every 4 (four) hours as needed for wheezing or shortness of breath.   Blood  Pressure Kit Devi 1 Device by Does not apply route once a week.   cefadroxil 500 MG capsule Commonly known as: DURICEF Take 1 capsule (500 mg total) by mouth 2 (two) times daily.   cetirizine  10 MG tablet Commonly known as: ZyrTEC  Allergy Take 1 tablet (10 mg total) by mouth daily.   Doxylamine -Pyridoxine  10-10 MG Tbec Commonly known as: Diclegis  Take 2 tablets by mouth at bedtime. If symptoms persist, add one tablet in the morning and one in the afternoon   metroNIDAZOLE  0.75 % vaginal gel Commonly known as: METROGEL  Place 1 Applicatorful vaginally at bedtime. Apply one applicatorful to vagina at bedtime for 5 days   Prenatal 28-0.8 MG Tabs Take 1 tablet by mouth daily.   promethazine  25 MG tablet Commonly known as: PHENERGAN  Take 1 tablet (25 mg total) by mouth every 6 (six) hours as needed for nausea or vomiting.        Noreene Bearded, PA

## 2023-06-28 ENCOUNTER — Encounter (HOSPITAL_COMMUNITY): Payer: Self-pay

## 2023-06-28 ENCOUNTER — Ambulatory Visit (HOSPITAL_COMMUNITY): Admission: RE | Admit: 2023-06-28 | Source: Ambulatory Visit

## 2023-06-28 DIAGNOSIS — O209 Hemorrhage in early pregnancy, unspecified: Secondary | ICD-10-CM | POA: Diagnosis not present

## 2023-06-28 DIAGNOSIS — Z3A09 9 weeks gestation of pregnancy: Secondary | ICD-10-CM

## 2023-06-28 LAB — GC/CHLAMYDIA PROBE AMP (~~LOC~~) NOT AT ARMC
Chlamydia: NEGATIVE
Comment: NEGATIVE
Comment: NORMAL
Neisseria Gonorrhea: NEGATIVE

## 2023-06-28 MED ORDER — CEFADROXIL 500 MG PO CAPS: 500.0000 mg | ORAL_CAPSULE | Freq: Two times a day (BID) | ORAL | 0 refills | Status: DC

## 2023-06-28 MED ORDER — METRONIDAZOLE 0.75 % VA GEL: 1.0000 | Freq: Every day | VAGINAL | 0 refills | Status: DC

## 2023-06-29 ENCOUNTER — Encounter: Payer: Self-pay | Admitting: Obstetrics

## 2023-06-29 ENCOUNTER — Encounter: Admitting: Obstetrics

## 2023-06-29 ENCOUNTER — Telehealth: Payer: Self-pay

## 2023-06-29 ENCOUNTER — Other Ambulatory Visit: Payer: Self-pay

## 2023-06-29 DIAGNOSIS — O209 Hemorrhage in early pregnancy, unspecified: Secondary | ICD-10-CM

## 2023-06-29 NOTE — Telephone Encounter (Signed)
 S/w pt and she stated that she had large clots and bleeding yesterday morning. She reports none today  that bleeding has slowed down today. Advised that orders were placed for a follow up u/s and schedulers will call. Advised of bleeding precautions/abnormal symptoms and to return to mau if they occur, pt agreed.

## 2023-06-30 ENCOUNTER — Other Ambulatory Visit: Payer: Self-pay | Admitting: Obstetrics and Gynecology

## 2023-06-30 DIAGNOSIS — O3680X0 Pregnancy with inconclusive fetal viability, not applicable or unspecified: Secondary | ICD-10-CM

## 2023-06-30 DIAGNOSIS — O039 Complete or unspecified spontaneous abortion without complication: Secondary | ICD-10-CM

## 2023-06-30 DIAGNOSIS — O0990 Supervision of high risk pregnancy, unspecified, unspecified trimester: Secondary | ICD-10-CM

## 2023-07-11 ENCOUNTER — Ambulatory Visit (HOSPITAL_COMMUNITY)
Admission: RE | Admit: 2023-07-11 | Discharge: 2023-07-11 | Disposition: A | Discharge status: Home / Self Care | Source: Ambulatory Visit | Attending: Obstetrics and Gynecology | Admitting: Obstetrics and Gynecology

## 2023-07-11 DIAGNOSIS — O039 Complete or unspecified spontaneous abortion without complication: Secondary | ICD-10-CM | POA: Insufficient documentation

## 2023-07-11 DIAGNOSIS — Z3687 Encounter for antenatal screening for uncertain dates: Secondary | ICD-10-CM | POA: Diagnosis not present

## 2023-07-11 DIAGNOSIS — Z3682 Encounter for antenatal screening for nuchal translucency: Secondary | ICD-10-CM | POA: Diagnosis not present

## 2023-07-11 DIAGNOSIS — O0990 Supervision of high risk pregnancy, unspecified, unspecified trimester: Secondary | ICD-10-CM | POA: Diagnosis present

## 2023-07-11 DIAGNOSIS — Z3A00 Weeks of gestation of pregnancy not specified: Secondary | ICD-10-CM | POA: Diagnosis not present

## 2023-07-18 ENCOUNTER — Encounter: Admitting: Obstetrics and Gynecology

## 2023-07-29 ENCOUNTER — Encounter: Admitting: Family Medicine

## 2023-08-03 ENCOUNTER — Other Ambulatory Visit: Payer: Self-pay | Admitting: Medical Genetics

## 2023-08-20 ENCOUNTER — Ambulatory Visit
Admission: RE | Admit: 2023-08-20 | Discharge: 2023-08-20 | Disposition: A | Discharge status: Home / Self Care | Source: Ambulatory Visit | Attending: Family Medicine | Admitting: Family Medicine

## 2023-08-20 ENCOUNTER — Other Ambulatory Visit: Payer: Self-pay

## 2023-08-20 VITALS — BP 136/81 | HR 52 | Temp 98.1°F | Resp 16

## 2023-08-20 DIAGNOSIS — K0889 Other specified disorders of teeth and supporting structures: Secondary | ICD-10-CM

## 2023-08-20 DIAGNOSIS — K047 Periapical abscess without sinus: Secondary | ICD-10-CM

## 2023-08-20 MED ORDER — NAPROXEN 500 MG PO TABS: 500.0000 mg | ORAL_TABLET | Freq: Two times a day (BID) | ORAL | 0 refills | Status: DC | PRN

## 2023-08-20 MED ORDER — AMOXICILLIN-POT CLAVULANATE 875-125 MG PO TABS: 1.0000 | ORAL_TABLET | Freq: Two times a day (BID) | ORAL | 0 refills | Status: DC

## 2023-08-20 MED ORDER — KETOROLAC TROMETHAMINE 30 MG/ML IJ SOLN
30.0000 mg | Freq: Once | INTRAMUSCULAR | Status: AC
  Administered 2023-08-20: 30 mg via INTRAMUSCULAR

## 2023-08-20 NOTE — ED Provider Notes (Signed)
 [facility]    CSN: [id number] Arrival date & time: 08/20/23  0901      History   Chief Complaint Chief Complaint  Patient presents with   Dental Problem    Back left bottom tooth hurt - Entered by patient    HPI Amy Elliott is a 23 y.o. female with a past medical history of anemia and depression presents for dental pain.  Patient reports she has had a broken left lower molar for an undetermined amount of time.  States over the past 4 days it has been causing her more consistent pain.  She denies any facial swelling or drainage from around the tooth.  Does feel like the gumline is swollen.  No fevers or chills.  She denies pregnancy or breast-feeding.  Chart review shows recent pregnancy in June with miscarriage.  OB ultrasound on June 12 showed no embryo.  Patient reports LMP July 12.  She states she does not currently have a dentist.  She has been doing salt water  gargles, Orajel, Tylenol  for symptoms without improvement.  No other concerns at this time.  HPI  Past Medical History:  Diagnosis Date   Anemia    Depression 2018   Seizures [facility]) Dec 31-2022   Eclampsia    Patient Active Problem List   Diagnosis Date Noted   Supervision of high risk pregnancy, antepartum 06/15/2023   Pain in joint of left shoulder 04/22/2023   COVID-19 vaccination declined 04/22/2023   Tetanus, diphtheria, and acellular pertussis (Tdap) vaccination declined 04/22/2023   Vaginal odor 02/15/2023   Chest pain 08/11/2022   Screening for STDs (sexually transmitted diseases) 08/11/2022   Menstrual period late 08/11/2022   Seasonal allergies 08/11/2022   Wheezing on auscultation 08/11/2022   Preeclampsia in postpartum period 01/10/2021   Postpartum care following cesarean delivery 01/06/2021   Gestational hypertension 12/30/2020   Atypical squamous cell changes of undetermined significance (ASCUS) on vaginal cytology 06/23/2020   Moderate episode of recurrent major depressive disorder  (HCC) 04/07/2017   Generalized anxiety disorder 04/07/2017   Sleep difficulties 04/07/2017   Class 3 severe obesity due to excess calories with body mass index (BMI) of 40.0 to 44.9 in adult 08/09/2016    Past Surgical History:  Procedure Laterality Date   CESAREAN SECTION  01/01/2021   Procedure: CESAREAN SECTION;  Surgeon: Eldonna Suzen Octave, MD;  Location: [facility] LD [facility]S;  Service: Obstetrics;;   WISDOM TOOTH EXTRACTION  2020    OB History     Gravida  3   Para  1   Term  1   Preterm  0   AB  1   Living  1      SAB  1   IAB  0   Ectopic  0   Multiple  0   Live Births  1            Home Medications    Prior to Admission medications   Medication Sig Start Date End Date Taking? Authorizing Provider  amoxicillin -clavulanate (AUGMENTIN ) 875-125 MG tablet Take 1 tablet by mouth every 12 (twelve) hours. 08/20/23  Yes Juniel Groene, Jodi R, NP  naproxen  (NAPROSYN ) 500 MG tablet Take 1 tablet (500 mg total) by mouth 2 (two) times daily as needed (dental pain). 08/21/23  Yes Linzi Ohlinger, Jodi R, NP  albuterol  (PROVENTIL ) (2.5 MG/3ML) 0.083% nebulizer solution Take 3 mLs (2.5 mg total) by nebulization every 4 (four) hours as needed for wheezing or shortness of breath. Patient not taking: Reported  on 06/15/2023 08/06/22 08/06/23  Petrina Pries, NP  Blood Pressure Monitoring (BLOOD PRESSURE KIT) DEVI 1 Device by Does not apply route once a week. 06/15/23   Constant, Peggy, MD  cetirizine  (ZYRTEC  ALLERGY) 10 MG tablet Take 1 tablet (10 mg total) by mouth daily. 08/06/22 08/06/23  Petrina Pries, NP  Doxylamine -Pyridoxine  (DICLEGIS ) 10-10 MG TBEC Take 2 tablets by mouth at bedtime. If symptoms persist, add one tablet in the morning and one in the afternoon Patient not taking: Reported on 06/15/2023 06/15/23   Constant, Peggy, MD  metroNIDAZOLE  (METROGEL ) 0.75 % vaginal gel Place 1 Applicatorful vaginally at bedtime. Apply one applicatorful to vagina at bedtime for 5 days 06/28/23   Wallace Search A, PA   Prenatal 28-0.8 MG TABS Take 1 tablet by mouth daily. 05/26/23   Eveline Lynwood MATSU, MD  promethazine  (PHENERGAN ) 25 MG tablet Take 1 tablet (25 mg total) by mouth every 6 (six) hours as needed for nausea or vomiting. Patient not taking: Reported on 06/15/2023 05/26/23   Eveline Lynwood MATSU, MD    Family History Family History  Problem Relation Age of Onset   Hypertension Mother    Heart Problems Mother        Pacemaker w/ defib   Varicose Veins Father    Diabetes Maternal Grandfather     Social History Social History   Tobacco Use   Smoking status: Some Days    Current packs/day: 0.50    Average packs/day: 0.5 packs/day for 0.5 years (0.3 ttl pk-yrs)    Types: Cigars, Cigarettes   Smokeless tobacco: Never  Vaping Use   Vaping status: Some Days  Substance Use Topics   Alcohol use: Not Currently    Comment: not since confirmed pregnancy. Last drink in January   Drug use: Yes    Types: Marijuana    Comment: April 2022     Allergies   Banana and Other   Review of Systems Review of Systems  HENT:  Positive for dental problem.      Physical Exam Triage Vital Signs ED Triage Vitals  Encounter Vitals Group     BP 08/20/23 0910 136/81     Girls Systolic BP Percentile --      Girls Diastolic BP Percentile --      Boys Systolic BP Percentile --      Boys Diastolic BP Percentile --      Pulse Rate 08/20/23 0910 (!) 52     Resp 08/20/23 0910 16     Temp 08/20/23 0910 98.1 F (36.7 C)     Temp Source 08/20/23 0910 Oral     SpO2 08/20/23 0910 97 %     Weight --      Height --      Head Circumference --      Peak Flow --      Pain Score 08/20/23 0908 9     Pain Loc --      Pain Education --      Exclude from Growth Chart --    No data found.  Updated Vital Signs BP 136/81   Pulse (!) 52   Temp 98.1 F (36.7 C) (Oral)   Resp 16   LMP 07/23/2023 (Approximate)   SpO2 97%   Breastfeeding No   Visual Acuity Right Eye Distance:   Left Eye Distance:   Bilateral  Distance:    Right Eye Near:   Left Eye Near:    Bilateral Near:     Physical Exam  Vitals and nursing note reviewed.  Constitutional:      General: She is not in acute distress.    Appearance: Normal appearance. She is obese. She is not ill-appearing.  HENT:     Head: Normocephalic and atraumatic.     Mouth/Throat:      Comments: Tooth #17 is broken with mild swelling of the gumline.  No drainage or visible abscess.  Gums are tender to palpation.  There is no facial swelling. Eyes:     Pupils: Pupils are equal, round, and reactive to light.  Cardiovascular:     Rate and Rhythm: Bradycardia present.  Pulmonary:     Effort: Pulmonary effort is normal.  Skin:    General: Skin is warm and dry.  Neurological:     General: No focal deficit present.     Mental Status: She is alert and oriented to person, place, and time.  Psychiatric:        Mood and Affect: Mood normal.        Behavior: Behavior normal.      UC Treatments / Results  Labs (all labs ordered are listed, but only abnormal results are displayed) Labs Reviewed - No data to display BMP8+eGFR Order: [id number]  Status: Final result     Next appt: None     Dx: Pain in joint of left shoulder   Test Result Released: Yes (seen)     Messages: Seen   2 Result Notes     1 Patient Communication     View Follow-Up Encounter          Component Ref Range & Units (hover) 4 mo ago (04/22/23) 2 yr ago (01/14/21) 2 yr ago (01/13/21) 2 yr ago (01/12/21) 2 yr ago (01/11/21) 2 yr ago (01/10/21) 2 yr ago (12/30/20)  Glucose 94 71 96 CM 145 High  CM 104 High  CM 100 High  CM 79 CM  BUN 11 20 24  High  29 High  49 High  51 High  7  Creatinine, Ser 1.01 High  2.30 High  3.37 High  R 4.45 High  R, CM 9.25 High  R 10.24 High  R 0.77 R  eGFR 80 30 Low        BUN/Creatinine Ratio 11 9       Sodium 140 141 140 R 138 R 139 R 139 R 135 R  Potassium 4.5 4.6 3.8 R 3.3 Low  R 4.1 R 4.5 R 3.8 R  Chloride 103 100 103 R 102 R 108 R 110 R  109 R  CO2 22 25 27  R 23 R 16 Low  R 16 Low  R 21 Low  R  Calcium  9.4 10.0 8.5 Low  R 8.4 Low  R 9.0 R 8.7 Low  R 8.6 Low  R  Resulting Agency [facility]         Narrative Performed by: HOY[facility] Performed at:  [address] [place], [place]  [id number] Lab Director: Frankey Sas MD, Phone:  [phone]  Specimen Collected: 04/22/23 11:00 Last Resulted: 04/23/23 04:35    EKG   Radiology No results found.  Procedures Procedures (including critical care time)  Medications Ordered in UC Medications  ketorolac  (TORADOL ) 30 MG/ML injection 30 mg (30 mg Intramuscular Given 08/20/23 0927)    Initial Impression / Assessment and Plan / UC Course  I have reviewed the triage vital  signs and the nursing notes.  Pertinent labs & imaging results that were available during my care of the patient were reviewed by me and considered in my medical decision making (see chart for details).     I reviewed exam and symptoms with patient.  Will treat for dental infection with Augmentin  twice daily for 7 days.  She was given Toradol  injection in clinic for her dental pain.  She was monitored for 10 minutes after injection with no reaction noted and tolerated well.  She was instructed no NSAIDs for 24 hours and verbalized understanding.  Will do trial of naproxen  to start tomorrow, 8/10.  She may continue Tylenol , salt water  gargles and Orajel.  Did discuss with patient this is a temporary fix of her symptoms and that she must see a dentist as soon as possible for further treatment.  Patient verbalized understanding and states she will contact Medicaid to find a dentist as soon as possible.  She was instructed to go to the ER for any worsening symptoms, red flags reviewed and patient verbalized understanding. Final Clinical Impressions(s) / UC Diagnoses   Final diagnoses:  Dental infection  Pain, dental      Discharge Instructions      You were given a Toradol  injection in clinic today. Do not take any over the counter NSAID's such as Advil , ibuprofen , Aleve , or naproxen  for 24 hours. You may take tylenol  if needed.  Start Augmentin  twice daily for 7 days.  You may continue salt water  gargles and over-the-counter Tylenol  as needed.  You may start the prescription strength naproxen  tomorrow, 8/10.  Please follow-up with a dentist ASAP as this treatment is only a temporary fix and your symptoms will not completely resolve until you are seen by dentist.  Please go to the ER if you develop any worsening symptoms before you see a dentist.  This includes but is not limited to fever/chills, facial swelling, worsening dental pain, or any new concerns that arise.  Hope you feel better soon!      ED Prescriptions     Medication Sig Dispense Auth. Provider   amoxicillin -clavulanate (AUGMENTIN ) 875-125 MG tablet Take 1 tablet by mouth every 12 (twelve) hours. 14 tablet Eyana Stolze, Jodi R, NP   naproxen  (NAPROSYN ) 500 MG tablet Take 1 tablet (500 mg total) by mouth 2 (two) times daily as needed (dental pain). 14 tablet Wisam Siefring, Jodi R, NP      PDMP not reviewed this encounter.   Loreda Myla SAUNDERS, NP 08/20/23 [phone]

## 2023-08-20 NOTE — ED Triage Notes (Addendum)
 Pt c/o pain toothx5d. Pt does have braces. Pt states does not have a dentist. Pt states does not have dental insurance.

## 2023-08-20 NOTE — Discharge Instructions (Addendum)
 You were given a Toradol  injection in clinic today. Do not take any over the counter NSAID's such as Advil , ibuprofen , Aleve , or naproxen  for 24 hours. You may take tylenol  if needed.  Start Augmentin  twice daily for 7 days.  You may continue salt water  gargles and over-the-counter Tylenol  as needed.  You may start the prescription strength naproxen  tomorrow, 8/10.  Please follow-up with a dentist ASAP as this treatment is only a temporary fix and your symptoms will not completely resolve until you are seen by dentist.  Please go to the ER if you develop any worsening symptoms before you see a dentist.  This includes but is not limited to fever/chills, facial swelling, worsening dental pain, or any new concerns that arise.  Hope you feel better soon!

## 2023-09-02 ENCOUNTER — Encounter: Payer: Self-pay | Admitting: Family Medicine

## 2023-09-08 ENCOUNTER — Ambulatory Visit: Payer: Self-pay | Admitting: Family Medicine

## 2023-09-09 ENCOUNTER — Ambulatory Visit: Payer: Self-pay | Admitting: Family Medicine

## 2023-09-21 ENCOUNTER — Ambulatory Visit: Admitting: Family Medicine

## 2023-10-16 ENCOUNTER — Inpatient Hospital Stay: Admission: RE | Admit: 2023-10-16 | Discharge: 2023-10-16 | Attending: Family Medicine

## 2023-10-16 ENCOUNTER — Other Ambulatory Visit: Payer: Self-pay

## 2023-10-16 VITALS — BP 132/79 | HR 76 | Temp 98.4°F | Resp 16

## 2023-10-16 DIAGNOSIS — K047 Periapical abscess without sinus: Secondary | ICD-10-CM

## 2023-10-16 MED ORDER — NAPROXEN 500 MG PO TABS: 500.0000 mg | ORAL_TABLET | Freq: Two times a day (BID) | ORAL | 0 refills | Status: AC | PRN

## 2023-10-16 MED ORDER — AMOXICILLIN-POT CLAVULANATE 875-125 MG PO TABS: 1.0000 | ORAL_TABLET | Freq: Two times a day (BID) | ORAL | 0 refills | Status: AC

## 2023-10-16 NOTE — ED Provider Notes (Signed)
 [facility]    CSN: [id number] Arrival date & time: 10/16/23  1159      History   Chief Complaint Chief Complaint  Patient presents with   Dental Problem    Severe pain - Entered by patient    HPI Amy Elliott is a 23 y.o. female presents for dental pain.  Patient reports 1 week of pain to her left lower back molar.  She was seen in urgent care on August 9th for same complaint.  She was given Augmentin  and naproxen  and states symptoms resolved.  She reports due to transportation as well as financial restriction she has been unable to go to a dentist.  Denies any fevers or facial swelling.  No other concerns at this time.  HPI  Past Medical History:  Diagnosis Date   Anemia    Depression 2018   Seizures [facility]) Dec 31-2022   Eclampsia    Patient Active Problem List   Diagnosis Date Noted   Supervision of high risk pregnancy, antepartum 06/15/2023   Pain in joint of left shoulder 04/22/2023   COVID-19 vaccination declined 04/22/2023   Tetanus, diphtheria, and acellular pertussis (Tdap) vaccination declined 04/22/2023   Vaginal odor 02/15/2023   Chest pain 08/11/2022   Screening for STDs (sexually transmitted diseases) 08/11/2022   Menstrual period late 08/11/2022   Seasonal allergies 08/11/2022   Wheezing on auscultation 08/11/2022   Preeclampsia in postpartum period 01/10/2021   Postpartum care following cesarean delivery 01/06/2021   Gestational hypertension 12/30/2020   Atypical squamous cell changes of undetermined significance (ASCUS) on vaginal cytology 06/23/2020   Moderate episode of recurrent major depressive disorder (HCC) 04/07/2017   Generalized anxiety disorder 04/07/2017   Sleep difficulties 04/07/2017   Class 3 severe obesity due to excess calories with body mass index (BMI) of 40.0 to 44.9 in adult [facility]) 08/09/2016    Past Surgical History:  Procedure Laterality Date   CESAREAN SECTION  01/01/2021   Procedure: CESAREAN SECTION;  Surgeon:  Eldonna Suzen Octave, MD;  Location: [facility] LD ORS;  Service: Obstetrics;;   WISDOM TOOTH EXTRACTION  2020    OB History     Gravida  3   Para  1   Term  1   Preterm  0   AB  1   Living  1      SAB  1   IAB  0   Ectopic  0   Multiple  0   Live Births  1            Home Medications    Prior to Admission medications   Medication Sig Start Date End Date Taking? Authorizing Provider  amoxicillin -clavulanate (AUGMENTIN ) 875-125 MG tablet Take 1 tablet by mouth every 12 (twelve) hours. 10/16/23  Yes Sreya Froio, Jodi R, NP  naproxen  (NAPROSYN ) 500 MG tablet Take 1 tablet (500 mg total) by mouth 2 (two) times daily as needed for up to 7 days for moderate pain (pain score 4-6). 10/16/23 10/23/23 Yes Terence Googe, Jodi R, NP  albuterol  (PROVENTIL ) (2.5 MG/3ML) 0.083% nebulizer solution Take 3 mLs (2.5 mg total) by nebulization every 4 (four) hours as needed for wheezing or shortness of breath. Patient not taking: Reported on 06/15/2023 08/06/22 08/06/23  Petrina Pries, NP  Blood Pressure Monitoring (BLOOD PRESSURE KIT) DEVI 1 Device by Does not apply route once a week. 06/15/23   Constant, Peggy, MD  Doxylamine -Pyridoxine  (DICLEGIS ) 10-10 MG TBEC Take 2 tablets by mouth at bedtime. If symptoms persist, add one  tablet in the morning and one in the afternoon Patient not taking: Reported on 06/15/2023 06/15/23   Constant, Peggy, MD  promethazine  (PHENERGAN ) 25 MG tablet Take 1 tablet (25 mg total) by mouth every 6 (six) hours as needed for nausea or vomiting. Patient not taking: Reported on 06/15/2023 05/26/23   Eveline Lynwood MATSU, MD    Family History Family History  Problem Relation Age of Onset   Hypertension Mother    Heart Problems Mother        Pacemaker w/ defib   Varicose Veins Father    Diabetes Maternal Grandfather     Social History Social History   Tobacco Use   Smoking status: Some Days    Current packs/day: 0.50    Average packs/day: 0.5 packs/day for 0.5 years (0.3 ttl pk-yrs)     Types: Cigars, Cigarettes   Smokeless tobacco: Never  Vaping Use   Vaping status: Some Days  Substance Use Topics   Alcohol use: Not Currently    Comment: not since confirmed pregnancy. Last drink in January   Drug use: Yes    Types: Marijuana    Comment: April 2022     Allergies   Banana and Other   Review of Systems Review of Systems  HENT:  Positive for dental problem.      Physical Exam Triage Vital Signs ED Triage Vitals  Encounter Vitals Group     BP 10/16/23 1206 132/79     Girls Systolic BP Percentile --      Girls Diastolic BP Percentile --      Boys Systolic BP Percentile --      Boys Diastolic BP Percentile --      Pulse Rate 10/16/23 1206 76     Resp 10/16/23 1206 16     Temp 10/16/23 1206 98.4 F (36.9 C)     Temp Source 10/16/23 1206 Oral     SpO2 10/16/23 1206 98 %     Weight --      Height --      Head Circumference --      Peak Flow --      Pain Score 10/16/23 1204 9     Pain Loc --      Pain Education --      Exclude from Growth Chart --    No data found.  Updated Vital Signs BP 132/79   Pulse 76   Temp 98.4 F (36.9 C) (Oral)   Resp 16   LMP 09/17/2023 (Approximate)   SpO2 98%   Visual Acuity Right Eye Distance:   Left Eye Distance:   Bilateral Distance:    Right Eye Near:   Left Eye Near:    Bilateral Near:     Physical Exam Vitals and nursing note reviewed.  Constitutional:      General: She is not in acute distress.    Appearance: Normal appearance. She is not ill-appearing.  HENT:     Head: Normocephalic and atraumatic.     Mouth/Throat:      Comments: There is swelling in the gumline of tooth 17.  Tooth is partially broken.  There is no facial swelling.  No fluctuance. Eyes:     Pupils: Pupils are equal, round, and reactive to light.  Cardiovascular:     Rate and Rhythm: Normal rate.  Pulmonary:     Effort: Pulmonary effort is normal.  Skin:    General: Skin is warm and dry.  Neurological:     General:  No  focal deficit present.     Mental Status: She is alert and oriented to person, place, and time.  Psychiatric:        Mood and Affect: Mood normal.        Behavior: Behavior normal.      UC Treatments / Results  Labs (all labs ordered are listed, but only abnormal results are displayed) Labs Reviewed - No data to display  EKG   Radiology No results found.  Procedures Procedures (including critical care time)  Medications Ordered in UC Medications - No data to display  Initial Impression / Assessment and Plan / UC Course  I have reviewed the triage vital signs and the nursing notes.  Pertinent labs & imaging results that were available during my care of the patient were reviewed by me and considered in my medical decision making (see chart for details).     Reviewed exam and symptoms with patient.  Will do Augmentin  and naproxen .  I did stress the importance of her seeing a dentist otherwise this will be a reoccurring issue.  I did give her additional dental resources as well.  ER precautions reviewed and patient verbalized understanding Final Clinical Impressions(s) / UC Diagnoses   Final diagnoses:  Dental infection     Discharge Instructions      Start Augmentin  twice daily for 7 days.  You may take naproxen  twice daily as needed for pain.  Please follow-up with a dentist as soon as possible for further treatment as antibiotics are temporary treatment to your issue.  Please go to the ER if you develop any worsening symptoms such as fever or facial swelling.  Hope you feel better soon!    ED Prescriptions     Medication Sig Dispense Auth. Provider   amoxicillin -clavulanate (AUGMENTIN ) 875-125 MG tablet Take 1 tablet by mouth every 12 (twelve) hours. 14 tablet Nekisha Mcdiarmid, Jodi R, NP   naproxen  (NAPROSYN ) 500 MG tablet Take 1 tablet (500 mg total) by mouth 2 (two) times daily as needed for up to 7 days for moderate pain (pain score 4-6). 14 tablet Aman Batley, Jodi R, NP       PDMP not reviewed this encounter.   Loreda Myla SAUNDERS, NP 10/16/23 1220

## 2023-10-16 NOTE — Discharge Instructions (Signed)
 Start Augmentin  twice daily for 7 days.  You may take naproxen  twice daily as needed for pain.  Please follow-up with a dentist as soon as possible for further treatment as antibiotics are temporary treatment to your issue.  Please go to the ER if you develop any worsening symptoms such as fever or facial swelling.  Hope you feel better soon!

## 2023-10-16 NOTE — ED Triage Notes (Signed)
 Pt c/o pain in tooth in same side. Pt states hasn't been able to go due to work schedule or no money for transportation

## 2023-10-25 ENCOUNTER — Other Ambulatory Visit: Payer: Self-pay | Admitting: Medical Genetics

## 2023-10-25 DIAGNOSIS — Z006 Encounter for examination for normal comparison and control in clinical research program: Secondary | ICD-10-CM

## 2023-11-14 ENCOUNTER — Encounter: Payer: Self-pay | Admitting: Radiology

## 2024-03-02 ENCOUNTER — Encounter: Admitting: Family Medicine
